# Patient Record
Sex: Female | Born: 1960
Health system: Southern US, Community
[De-identification: ages and names within clinical notes are randomized; demographics above are authoritative.]

## PROBLEM LIST (undated history)

## (undated) DIAGNOSIS — I499 Cardiac arrhythmia, unspecified: Secondary | ICD-10-CM

## (undated) DIAGNOSIS — R011 Cardiac murmur, unspecified: Secondary | ICD-10-CM

## (undated) DIAGNOSIS — I493 Ventricular premature depolarization: Secondary | ICD-10-CM

## (undated) DIAGNOSIS — M1990 Unspecified osteoarthritis, unspecified site: Secondary | ICD-10-CM

## (undated) DIAGNOSIS — J45909 Unspecified asthma, uncomplicated: Secondary | ICD-10-CM

## (undated) HISTORY — DX: Unspecified osteoarthritis, unspecified site: M19.90

## (undated) HISTORY — DX: Unspecified asthma, uncomplicated: J45.909

## (undated) HISTORY — DX: Cardiac murmur, unspecified: R01.1

## (undated) HISTORY — DX: Ventricular premature depolarization: I49.3

## (undated) HISTORY — DX: Cardiac arrhythmia, unspecified: I49.9

---

## 2015-04-10 HISTORY — PX: NOSE SURGERY: SHX723

## 2016-02-29 LAB — HM DEXA SCAN

## 2016-03-20 ENCOUNTER — Encounter: Payer: Self-pay | Admitting: Family Medicine

## 2019-07-21 ENCOUNTER — Telehealth: Payer: Self-pay | Admitting: Family Medicine

## 2019-07-21 NOTE — Telephone Encounter (Signed)
Medical records received for new pt. First appt is 08/20/2019. Records from [facility]

## 2019-08-20 ENCOUNTER — Ambulatory Visit (INDEPENDENT_AMBULATORY_CARE_PROVIDER_SITE_OTHER): Payer: 59 | Admitting: Family Medicine

## 2019-08-20 ENCOUNTER — Telehealth: Payer: Self-pay | Admitting: Internal Medicine

## 2019-08-20 ENCOUNTER — Other Ambulatory Visit: Payer: Self-pay

## 2019-08-20 ENCOUNTER — Encounter: Payer: Self-pay | Admitting: Family Medicine

## 2019-08-20 VITALS — BP 120/78 | HR 63 | Temp 97.8°F | Ht 63.5 in | Wt 129.6 lb

## 2019-08-20 DIAGNOSIS — J4520 Mild intermittent asthma, uncomplicated: Secondary | ICD-10-CM

## 2019-08-20 DIAGNOSIS — I493 Ventricular premature depolarization: Secondary | ICD-10-CM | POA: Insufficient documentation

## 2019-08-20 DIAGNOSIS — R002 Palpitations: Secondary | ICD-10-CM

## 2019-08-20 DIAGNOSIS — R1010 Upper abdominal pain, unspecified: Secondary | ICD-10-CM | POA: Diagnosis not present

## 2019-08-20 MED ORDER — ALBUTEROL SULFATE HFA 108 (90 BASE) MCG/ACT IN AERS
2.0000 | INHALATION_SPRAY | Freq: Four times a day (QID) | RESPIRATORY_TRACT | 1 refills | Status: DC | PRN
Start: 2019-08-20 — End: 2020-08-09

## 2019-08-20 MED ORDER — ESTRADIOL 0.0375 MG/24HR TD PTWK: 0.0375 mg | MEDICATED_PATCH | TRANSDERMAL | 5 refills | Status: DC

## 2019-08-20 MED ORDER — PROGESTERONE MICRONIZED 100 MG PO CAPS: 100.0000 mg | ORAL_CAPSULE | Freq: Every day | ORAL | 5 refills | Status: DC

## 2019-08-20 NOTE — Telephone Encounter (Signed)
No prior auth for Echo is needed. I have called Misty Stanley at [facility] who handles Echo and left a message to call back or all pt to schedule.  Reference number [id number] [email or web address]

## 2019-08-20 NOTE — Progress Notes (Signed)
Subjective:    Patient ID: Jill Barry, female    DOB: [birth year], 59 y.o.   MRN: [id number]  HPI Chief Complaint  Patient presents with  . new pt    new pt get established. seen cardiology for PVC and had echo, and plan was to have enough Echo- possibly cardiology referral,    She is new to the practice and here to establish care.  Her husband is with her today.  He is a hospitalist with [employer] [facility] [employer].  They recently moved here from [place]. Previous medical care: Dr. Alma Friendly in [place]. OB/GYN in [place]  Cardiologist in [place].   Her main concern today is being able to follow-up on palpitations, frequent PVCs and reportedly is due for a follow-up echocardiogram this year.  I have not yet received records from her cardiologist. they would like for me to order the echo and then they will establish with cardiology as well.  Her husband states he will call and schedule with EP physician. States palpitations are not as bad as they have been in the past but she is symptomatic. Reports she was on a beta-blocker for a few months but stopped it approximately 1 year ago.  States she did not like how she felt on the beta-blocker.  Asthma -states she was diagnosed several years ago.  Reports only needing albuterol 1 x per week on average. Used Advair last year but is no longer needing it.  She has been on hormone replacement therapy for the past 8 years.  This was prescribed by her OB/GYN in [place].  She is almost out of her refills.  States she was severely symptomatic before starting on hormone replacement and does not want to come off of this medication yet.  She is requesting a 1 year refill. Denies ever having any adverse effects from hormone therapy including blood clots.  Stopped periods at age 72.  History of normal DEXA. States she is taking calcium and vitamin D   She complains of intermittent upper abdominal pain.  Pain is consistently after eating. Her husband states he  questions if she could have H. pylori.  States he has been treated for H. pylori in the past.  Denies fever, chills, night sweats, fatigue, unexplained weight loss, headaches, dizziness, chest pain, shortness of breath, vomiting or diarrhea.  States her bowel movements are normal.   Health maintenance:  Mammogram: 2019 per patient  Colonoscopy: UTD per patient    Reviewed allergies, medications, past medical, surgical, family, and social history.    Review of Systems Pertinent positives and negatives in the history of present illness.     Objective:   Physical Exam Constitutional:      Appearance: Normal appearance.  Eyes:     Conjunctiva/sclera: Conjunctivae normal.     Pupils: Pupils are equal, round, and reactive to light.  Cardiovascular:     Rate and Rhythm: Normal rate and regular rhythm.     Pulses: Normal pulses.     Heart sounds: Normal heart sounds.  Pulmonary:     Effort: Pulmonary effort is normal.     Breath sounds: Normal breath sounds.  Abdominal:     General: Abdomen is flat. Bowel sounds are normal. There is no distension.     Palpations: Abdomen is soft. There is no mass.     Tenderness: There is no abdominal tenderness. There is no guarding or rebound.  Musculoskeletal:     Cervical back: Neck supple.  Right lower leg: No edema.     Left lower leg: No edema.  Skin:    General: Skin is warm and dry.     Capillary Refill: Capillary refill takes less than 2 seconds.  Neurological:     General: No focal deficit present.     Mental Status: She is alert and oriented to person, place, and time.     Cranial Nerves: No cranial nerve deficit.     Sensory: No sensory deficit.     Motor: No weakness.     Coordination: Coordination normal.  Psychiatric:        Mood and Affect: Mood normal.        Thought Content: Thought content normal.    BP 120/78   Pulse 63   Temp 97.8 F (36.6 C)   Ht 5' 3.5" (1.613 m)   Wt 129 lb 9.6 oz (58.8 kg)   SpO2 97%    BMI 22.60 kg/m       Assessment & Plan:  Palpitations - Plan: ECHOCARDIOGRAM COMPLETE, EKG 12-Lead -She is a pleasant 59 year old female who is new to the practice.  I do not have medical records from her cardiologist in [place].  She and her husband are quite knowledgeable about her health history.  Her husband is a hospitalist.  She is reportedly due for a follow-up echocardiogram which I will order today.  They will let me know if they need a referral in the system for cardiology.  Her husband plans to schedule this appointment.  Frequent PVCs - Plan: EKG 12-Lead -EKG shows NSR, rate 64 and unremarkable.  No PVCs observed  Upper abdominal pain -Discussed possible etiologies including GERD, stomach ulcer.  I will refer her to GI for further evaluation. Husband with hx of H. Pylori  Mild intermittent asthma without complication -well controlled. She will let me know if she is needing her rescue inhaler more than 2x per week.   Declines labs. States recent thyroid and electrolyte panel normal.  Follow up for fasting CPE.

## 2019-08-21 ENCOUNTER — Encounter: Payer: Self-pay | Admitting: Family Medicine

## 2019-08-21 ENCOUNTER — Encounter: Payer: Self-pay | Admitting: Nurse Practitioner

## 2019-08-25 ENCOUNTER — Encounter: Payer: Self-pay | Admitting: Family Medicine

## 2019-09-14 ENCOUNTER — Other Ambulatory Visit (INDEPENDENT_AMBULATORY_CARE_PROVIDER_SITE_OTHER): Payer: 59

## 2019-09-14 ENCOUNTER — Encounter: Payer: Self-pay | Admitting: Nurse Practitioner

## 2019-09-14 ENCOUNTER — Ambulatory Visit (INDEPENDENT_AMBULATORY_CARE_PROVIDER_SITE_OTHER): Payer: 59 | Admitting: Nurse Practitioner

## 2019-09-14 VITALS — BP 118/76 | HR 83 | Ht 63.0 in | Wt 130.2 lb

## 2019-09-14 DIAGNOSIS — R1013 Epigastric pain: Secondary | ICD-10-CM

## 2019-09-14 LAB — COMPREHENSIVE METABOLIC PANEL
ALT: 17 U/L (ref 0–35)
AST: 18 U/L (ref 0–37)
Albumin: 4.7 g/dL (ref 3.5–5.2)
Alkaline Phosphatase: 79 U/L (ref 39–117)
BUN: 10 mg/dL (ref 6–23)
CO2: 28 mEq/L (ref 19–32)
Calcium: 9.6 mg/dL (ref 8.4–10.5)
Chloride: 102 mEq/L (ref 96–112)
Creatinine, Ser: 0.69 mg/dL (ref 0.40–1.20)
GFR: 87.12 mL/min (ref >60.00)
Glucose, Bld: 104 mg/dL — ABNORMAL HIGH (ref 70–99)
Potassium: 3.9 mEq/L (ref 3.5–5.1)
Sodium: 137 mEq/L (ref 135–145)
Total Bilirubin: 0.5 mg/dL (ref 0.2–1.2)
Total Protein: 7.4 g/dL (ref 6.0–8.3)

## 2019-09-14 LAB — CBC
HCT: 42.7 % (ref 36.0–46.0)
Hemoglobin: 14.8 g/dL (ref 12.0–15.0)
MCHC: 34.8 g/dL (ref 30.0–36.0)
MCV: 96 fl (ref 78.0–100.0)
Platelets: 225 10*3/uL (ref 150.0–400.0)
RBC: 4.44 Mil/uL (ref 3.87–5.11)
RDW: 13.1 % (ref 11.5–15.5)
WBC: 6.7 10*3/uL (ref 4.0–10.5)

## 2019-09-14 LAB — LIPASE: Lipase: 20 U/L (ref 11.0–59.0)

## 2019-09-14 NOTE — Progress Notes (Signed)
ASSESSMENT / PLAN:   59 yo female with PMH significant for PVCs.   --Obtain basic labs, she hasn't had any in a year.  --No benefit with three weeks of Omeprazole. Stop Omeprazole since it hasn't been of benefit. Also gastric biopsies ( if done) may be more sensitive to H.pylori off PPI.  --She is very worried about a gastric tumor ---For further evaluation patient will be scheduled for EGD. The risks and benefits of EGD were discussed and the patient agrees to proceed.   # Colon cancer screening --Normal colonoscopy in [place] in 2014 ( she brought colonoscopy discharge instructions) --Will request full report  HPI:     Chief Complaint:  Upper abdominal pain.   Patient is a 59 yo female referred by PCP for evaluation of upper abdominal pain .  Patient is married to a Oncologist .   Over the last year patient has been having nonradiating, postprandial epigastric/LUQ pain.  Pain occurs after every meal regardless of what she eats but worse after larger meals. Symptoms only present after eating. Initially pain was more of a twisting discomfort but recently evolved into stabbing pain.  Drinking small amount of warm water and laying down helps.   No nausea nor vomiting. No weight loss, she gained ~ 15 pounds over the last year during the pandemic.  No hx of PUD.  No NSAID use.  She took Omeprazole 20 mg for 20 days without any benefit. She is very concerned about possibility of stomach cancer. Husband had H.pylori.   Hx of PVCs, none on EKG 08/20/19 at PCP's office. Scheduled for echo on 09/16/19.   Past Medical History:  Diagnosis Date  . Arthritis   . Asthma   . Heart murmur   . Irregular heart beat     Past Surgical History:  Procedure Laterality Date  . NOSE SURGERY  2017   Family History  Problem Relation Age of Onset  . Hypertension Father   . Diabetes Father   . Hyperlipidemia Father   . Cervical cancer Sister    Social History   Tobacco Use  .  Smoking status: Never Smoker  . Smokeless tobacco: Never Used  Substance Use Topics  . Alcohol use: Never  . Drug use: Never   Current Outpatient Medications  Medication Sig Dispense Refill  . albuterol (VENTOLIN HFA) 108 (90 Base) MCG/ACT inhaler Inhale 2 puffs into the lungs every 6 (six) hours as needed for wheezing or shortness of breath. 18 g 1  . esomeprazole (NEXIUM) 20 MG capsule Take 20 mg by mouth daily at 12 noon.    Marland Kitchen estradiol (CLIMARA - DOSED IN MG/24 HR) 0.0375 mg/24hr patch Place 1 patch (0.0375 mg total) onto the skin once a week. 4 patch 5  . progesterone (PROMETRIUM) 100 MG capsule Take 1 capsule (100 mg total) by mouth daily. 30 capsule 5   No current facility-administered medications for this visit.   No Known Allergies   Review of Systems: Positive for back pain, anxiety, arthritis, allergies.   All other systems reviewed and negative except where noted in HPI.   Creatinine clearance cannot be calculated (No successful lab value found.)   Physical Exam:    Wt Readings from Last 3 Encounters:  09/14/19 130 lb 3.2 oz (59.1 kg)  08/20/19 129 lb 9.6 oz (58.8 kg)    BP 118/76   Pulse 83   Ht 5\' 3"  (1.6 m)  Wt 130 lb 3.2 oz (59.1 kg)   SpO2 99%   BMI 23.06 kg/m  Constitutional:  Pleasant female in no acute distress. Psychiatric: Normal mood and affect. Behavior is normal. EENT: Pupils normal.  Conjunctivae are normal. No scleral icterus. Neck supple.  Cardiovascular: Normal rate, occas irregular beat. No edema Pulmonary/chest: Effort normal and breath sounds normal. No wheezing, rales or rhonchi. Abdominal: Soft, nondistended, nontender. Bowel sounds active throughout. There are no masses palpable. No hepatomegaly. Neurological: Alert and oriented to person place and time. Skin: Skin is warm and dry. No rashes noted.  Tye Savoy, NP  09/14/2019, 10:58 AM  Cc:  Referring Provider Girtha Rm, NP-C

## 2019-09-14 NOTE — Patient Instructions (Signed)
If you are age 59 or older, your body mass index should be between 23-30. Your Body mass index is 23.06 kg/m. If this is out of the aforementioned range listed, please consider follow up with your Primary Care Provider.  If you are age 11 or younger, your body mass index should be between 19-25. Your Body mass index is 23.06 kg/m. If this is out of the aformentioned range listed, please consider follow up with your Primary Care Provider.    Stop your omeprazole.  Your provider has requested that you go to the basement level for lab work before leaving today. Press "B" on the elevator. The lab is located at the first door on the left as you exit the elevator.  You have been scheduled for an endoscopy. Please follow written instructions given to you at your visit today. If you use inhalers (even only as needed), please bring them with you on the day of your procedure.  Due to recent changes in healthcare laws, you may see the results of your imaging and laboratory studies on MyChart before your provider has had a chance to review them.  We understand that in some cases there may be results that are confusing or concerning to you. Not all laboratory results come back in the same time frame and the provider may be waiting for multiple results in order to interpret others.  Please give [place] 48 hours in order for your provider to thoroughly review all the results before contacting the office for clarification of your results.

## 2019-09-14 NOTE — Progress Notes (Signed)
Reviewed and agree with documentation and assessment and plan. K. Veena Ayshia Gramlich , MD   

## 2019-09-16 ENCOUNTER — Ambulatory Visit (HOSPITAL_COMMUNITY): Payer: 59 | Attending: Cardiology

## 2019-09-16 ENCOUNTER — Other Ambulatory Visit: Payer: Self-pay

## 2019-09-16 DIAGNOSIS — R002 Palpitations: Secondary | ICD-10-CM | POA: Diagnosis not present

## 2019-09-17 NOTE — Progress Notes (Signed)
Subjective:    Patient ID: Jill Barry, female    DOB: November 21, 1960, 60 y.o.   MRN: [id number]  HPI Chief Complaint  Patient presents with   cpe    non fasting cpe, sees obgyn. stomach issues   She is fairly new to the practice and here for a complete physical exam. Previous medical care: moved here recently from [place].   Other providers: GI- Dr. Silverio Decamp   Recently saw GI for upper abdominal discomfort and is scheduled to have an EGD soon.  She stopped PPIs for the study.  H she is currently on hormone therapy and has been since 2013. She does not want to stop treatment.  Plans to schedule with an OB/GYN soon.  Would like a referral.  States she has trigger finger on her right hand which seems to be getting worse and is interfering with her daily life.  Would like to see a hand specialist.  She is tearful talking about her sister who was recently diagnosed with stage IV cervical cancer.  Her sister lives in [place].  She denies depression or anxiety.  I have ordered an echocardiogram due to history of palpitations.  She and her husband were going to call and schedule with a cardiologist but they have not done so.  States she wants to get her EGD and GI work-up completed before pursuing a cardiology work-up.  She denies worsening palpitations or chest pain, DOE or LE edema.  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  Denies smoking, drinking alcohol, drug use  Diet: fairly healthy but high in carbs. Excerise: Zumba   Immunizations: Pfizer Covid vaccines received with last one 3 weeks ago   Health maintenance:  Mammogram: last one in [place] 2019  Colonoscopy: 2014 in [place] and normal per records.  Last Gynecological Exam: In the past 2 years DEXA: 2019 - normal  Last Dental Exam: In the past 2 months Last Eye Exam: Has been years  Wears seatbelt always, uses sunscreen, smoke detectors in home and functioning, does not text while  driving and feels safe in home environment.   Reviewed allergies, medications, past medical, surgical, family, and social history.   Review of Systems Review of Systems Constitutional: -fever, -chills, -sweats, -unexpected weight change,-fatigue ENT: -runny nose, -ear pain, -sore throat Cardiology:  -chest pain, -palpitations, -edema Respiratory: -cough, -shortness of breath, -wheezing Gastroenterology: +abdominal pain, -nausea, -vomiting, -diarrhea, -constipation  Hematology: -bleeding or bruising problems Musculoskeletal: -arthralgias, -myalgias, -joint swelling, -back pain Ophthalmology: -vision changes Urology: -dysuria, -difficulty urinating, -hematuria, -urinary frequency, -urgency Neurology: -headache, -weakness, -tingling, -numbness       Objective:   Physical Exam BP 140/70    Pulse 74    Ht 5' 3.25" (1.607 m)    Wt 128 lb 12.8 oz (58.4 kg)    BMI 22.64 kg/m   General Appearance:    Alert, cooperative, no distress, appears stated age  Head:    Normocephalic, without obvious abnormality, atraumatic  Eyes:    PERRL, conjunctiva/corneas clear, EOM's intact  Ears:    Normal TM's and external ear canals  Nose:  Mask in place  Throat:  Mask in place  Neck:   Supple, no lymphadenopathy;  thyroid:  no   enlargement/tenderness/nodules; no JVD  Back:    Spine nontender, no curvature, ROM normal, no CVA     tenderness  Lungs:     Clear to auscultation bilaterally without wheezes, rales or  ronchi; respirations unlabored  Chest Wall:    No tenderness or deformity   Heart:    Regular rate and rhythm, no murmur, rub   or gallop  Breast Exam:   Referred to OB/GYN  Abdomen:     Soft, non-tender, nondistended, normoactive bowel sounds,    no masses, no hepatosplenomegaly  Genitalia:   Referred to OB/GYN per her request     Extremities:   No clubbing, cyanosis or edema.  Trigger finger of her third digit on right hand.  Pulses:   2+ and symmetric all extremities  Skin:   Skin  color, texture, turgor normal, no rashes or lesions  Lymph nodes:   Cervical, supraclavicular, and axillary nodes normal  Neurologic:   CNII-XII intact, normal strength, sensation and gait; reflexes 2+ and symmetric throughout          Psych:   Normal mood, affect, hygiene and grooming.        Assessment & Plan:  Routine general medical examination at a health care facility -She is here today for a CPE.  This is her second visit with me.  Preventive health care reviewed.  I will refer her to OB/GYN for further examination and to update Pap smear.  Mammogram ordered and she will call to schedule this.  Counseling on healthy lifestyle including diet and exercise.  Immunizations reviewed.  She may return at her convenience for Shingrix vaccine.  Recommend that she wait at least 2 more weeks since she reports having had her second Covid vaccine 3 weeks ago.  Discussed safety and health promotion.  Encounter for screening for malignant neoplasm of breast, unspecified screening modality -She will call and schedule her mammogram at the breast center as discussed.  On hormone replacement therapy - Plan: Ambulatory referral to Gynecology -Discussed potential side effects.  She will follow-up with her OB/GYN regarding coming off of hormones.  Trigger middle finger of right hand - Plan: Ambulatory referral to Hand Surgery -This has become quite bothersome for her and she would like to see a specialist.  Referral made.  Screening for lipid disorders - Plan: Lipid panel -Follow-up pending results  Screening for thyroid disorder - Plan: TSH, T4, free, T3 -Follow-up pending results  Need for hepatitis C screening test - Plan: Hepatitis C antibody -Done per guidelines  Reviewed normal echocardiogram results with patient and her husband.  They have not yet called to schedule with a cardiologist as we discussed at her previous visit.  I recommend they do this.  They will let me know if they need a  referral.

## 2019-09-17 NOTE — Patient Instructions (Addendum)
You will receive a call from [facility] gynecology to schedule a visit  You will also receive a call from the hand center to schedule about the trigger finger  Call and schedule your mammogram at [facility].   You can call here and return for the shingles vaccine but give it a few more weeks since you just received the Covid vaccine recently.      Preventive Care 10-59 Years Old, Female Preventive care refers to visits with your health care provider and lifestyle choices that can promote health and wellness. This includes:  A yearly physical exam. This may also be called an annual well check.  Regular dental visits and eye exams.  Immunizations.  Screening for certain conditions.  Healthy lifestyle choices, such as eating a healthy diet, getting regular exercise, not using drugs or products that contain nicotine and tobacco, and limiting alcohol use. What can I expect for my preventive care visit? Physical exam Your health care provider will check your:  Height and weight. This may be used to calculate body mass index (BMI), which tells if you are at a healthy weight.  Heart rate and blood pressure.  Skin for abnormal spots. Counseling Your health care provider may ask you questions about your:  Alcohol, tobacco, and drug use.  Emotional well-being.  Home and relationship well-being.  Sexual activity.  Eating habits.  Work and work Statistician.  Method of birth control.  Menstrual cycle.  Pregnancy history. What immunizations do I need?  Influenza (flu) vaccine  This is recommended every year. Tetanus, diphtheria, and pertussis (Tdap) vaccine  You may need a Td booster every 10 years. Varicella (chickenpox) vaccine  You may need this if you have not been vaccinated. Zoster (shingles) vaccine  You may need this after age 9. Measles, mumps, and rubella (MMR) vaccine  You may need at least one dose of MMR if you were born in 1957 or later. You  may also need a second dose. Pneumococcal conjugate (PCV13) vaccine  You may need this if you have certain conditions and were not previously vaccinated. Pneumococcal polysaccharide (PPSV23) vaccine  You may need one or two doses if you smoke cigarettes or if you have certain conditions. Meningococcal conjugate (MenACWY) vaccine  You may need this if you have certain conditions. Hepatitis A vaccine  You may need this if you have certain conditions or if you travel or work in places where you may be exposed to hepatitis A. Hepatitis B vaccine  You may need this if you have certain conditions or if you travel or work in places where you may be exposed to hepatitis B. Haemophilus influenzae type b (Hib) vaccine  You may need this if you have certain conditions. Human papillomavirus (HPV) vaccine  If recommended by your health care provider, you may need three doses over 6 months. You may receive vaccines as individual doses or as more than one vaccine together in one shot (combination vaccines). Talk with your health care provider about the risks and benefits of combination vaccines. What tests do I need? Blood tests  Lipid and cholesterol levels. These may be checked every 5 years, or more frequently if you are over 3 years old.  Hepatitis C test.  Hepatitis B test. Screening  Lung cancer screening. You may have this screening every year starting at age 45 if you have a 30-pack-year history of smoking and currently smoke or have quit within the past 15 years.  Colorectal cancer screening. All adults  should have this screening starting at age 42 and continuing until age 34. Your health care provider may recommend screening at age 44 if you are at increased risk. You will have tests every 1-10 years, depending on your results and the type of screening test.  Diabetes screening. This is done by checking your blood sugar (glucose) after you have not eaten for a while (fasting). You  may have this done every 1-3 years.  Mammogram. This may be done every 1-2 years. Talk with your health care provider about when you should start having regular mammograms. This may depend on whether you have a family history of breast cancer.  BRCA-related cancer screening. This may be done if you have a family history of breast, ovarian, tubal, or peritoneal cancers.  Pelvic exam and Pap test. This may be done every 3 years starting at age 36. Starting at age 55, this may be done every 5 years if you have a Pap test in combination with an HPV test. Other tests  Sexually transmitted disease (STD) testing.  Bone density scan. This is done to screen for osteoporosis. You may have this scan if you are at high risk for osteoporosis. Follow these instructions at home: Eating and drinking  Eat a diet that includes fresh fruits and vegetables, whole grains, lean protein, and low-fat dairy.  Take vitamin and mineral supplements as recommended by your health care provider.  Do not drink alcohol if: ? Your health care provider tells you not to drink. ? You are pregnant, may be pregnant, or are planning to become pregnant.  If you drink alcohol: ? Limit how much you have to 0-1 drink a day. ? Be aware of how much alcohol is in your drink. In the [place] one drink equals one 12 oz bottle of beer (355 mL), one 5 oz glass of wine (148 mL), or one 1 oz glass of hard liquor (44 mL). Lifestyle  Take daily care of your teeth and gums.  Stay active. Exercise for at least 30 minutes on 5 or more days each week.  Do not use any products that contain nicotine or tobacco, such as cigarettes, e-cigarettes, and chewing tobacco. If you need help quitting, ask your health care provider.  If you are sexually active, practice safe sex. Use a condom or other form of birth control (contraception) in order to prevent pregnancy and STIs (sexually transmitted infections).  If told by your health care provider,  take low-dose aspirin daily starting at age 21. What's next?  Visit your health care provider once a year for a well check visit.  Ask your health care provider how often you should have your eyes and teeth checked.  Stay up to date on all vaccines. This information is not intended to replace advice given to you by your health care provider. Make sure you discuss any questions you have with your health care provider. Document Revised: 12/05/2017 Document Reviewed: 12/05/2017 Elsevier Patient Education  2020 [employer].

## 2019-09-18 ENCOUNTER — Encounter: Payer: Self-pay | Admitting: Family Medicine

## 2019-09-18 ENCOUNTER — Ambulatory Visit (INDEPENDENT_AMBULATORY_CARE_PROVIDER_SITE_OTHER): Payer: 59 | Admitting: Family Medicine

## 2019-09-18 ENCOUNTER — Other Ambulatory Visit: Payer: Self-pay

## 2019-09-18 VITALS — BP 140/70 | HR 74 | Ht 63.25 in | Wt 128.8 lb

## 2019-09-18 DIAGNOSIS — Z1159 Encounter for screening for other viral diseases: Secondary | ICD-10-CM

## 2019-09-18 DIAGNOSIS — Z79890 Hormone replacement therapy (postmenopausal): Secondary | ICD-10-CM

## 2019-09-18 DIAGNOSIS — M65331 Trigger finger, right middle finger: Secondary | ICD-10-CM | POA: Diagnosis not present

## 2019-09-18 DIAGNOSIS — Z13220 Encounter for screening for lipoid disorders: Secondary | ICD-10-CM | POA: Diagnosis not present

## 2019-09-18 DIAGNOSIS — Z0000 Encounter for general adult medical examination without abnormal findings: Secondary | ICD-10-CM | POA: Diagnosis not present

## 2019-09-18 DIAGNOSIS — Z1329 Encounter for screening for other suspected endocrine disorder: Secondary | ICD-10-CM

## 2019-09-18 DIAGNOSIS — Z1239 Encounter for other screening for malignant neoplasm of breast: Secondary | ICD-10-CM | POA: Diagnosis not present

## 2019-09-19 LAB — TSH: TSH: 2.4 u[IU]/mL (ref 0.450–4.500)

## 2019-09-19 LAB — LIPID PANEL
Chol/HDL Ratio: 3.8 ratio (ref 0.0–4.4)
Cholesterol, Total: 242 mg/dL — ABNORMAL HIGH (ref 100–199)
HDL: 64 mg/dL (ref >39)
LDL Chol Calc (NIH): 146 mg/dL — ABNORMAL HIGH (ref 0–99)
Triglycerides: 179 mg/dL — ABNORMAL HIGH (ref 0–149)
VLDL Cholesterol Cal: 32 mg/dL (ref 5–40)

## 2019-09-19 LAB — T3: T3, Total: 85 ng/dL (ref 71–180)

## 2019-09-19 LAB — HEPATITIS C ANTIBODY: Hep C Virus Ab: <0.1 s/co ratio (ref 0.0–0.9)

## 2019-09-19 LAB — T4, FREE: Free T4: 1.32 ng/dL (ref 0.82–1.77)

## 2019-09-20 NOTE — Progress Notes (Signed)
Her labs are fine including her thyroid function. She is negative for hepatitis C. Her LDL or bad cholesterol is elevated but her overall risk for heart disease is not high enough to start on a cholesterol medication per guidelines.

## 2019-09-28 MED FILL — ESTRADIOL TDS 0.0375 MG/DAY: 0.0375 | 28 days supply | Qty: 4 | Fill #1

## 2019-09-28 MED FILL — PROGESTERONE 100 MG CAPSULE: 100 | 30 days supply | Qty: 30 | Fill #1

## 2019-09-30 ENCOUNTER — Telehealth: Payer: Self-pay | Admitting: Family Medicine

## 2019-09-30 NOTE — Telephone Encounter (Signed)
Husband Dr Mohammed Kindle called and states issue with mammogram since left upper breast pain.  Dr Mohammed Kindle off this week. Please call Dr. Mohammed Kindle [phone]   Breast center thinks should be diagnostic instead of screening.

## 2019-09-30 NOTE — Telephone Encounter (Signed)
Pt is scheduled for tomorrow 

## 2019-10-01 ENCOUNTER — Ambulatory Visit (AMBULATORY_SURGERY_CENTER): Payer: 59 | Admitting: Gastroenterology

## 2019-10-01 ENCOUNTER — Encounter: Payer: Self-pay | Admitting: Gastroenterology

## 2019-10-01 ENCOUNTER — Ambulatory Visit: Payer: 59 | Admitting: Family Medicine

## 2019-10-01 ENCOUNTER — Encounter: Payer: Self-pay | Admitting: Family Medicine

## 2019-10-01 ENCOUNTER — Other Ambulatory Visit: Payer: Self-pay

## 2019-10-01 VITALS — BP 118/80 | HR 79 | Temp 97.7°F | Wt 129.8 lb

## 2019-10-01 VITALS — BP 138/79 | HR 57 | Temp 96.6°F | Resp 18 | Ht 63.0 in | Wt 130.0 lb

## 2019-10-01 DIAGNOSIS — K219 Gastro-esophageal reflux disease without esophagitis: Secondary | ICD-10-CM

## 2019-10-01 DIAGNOSIS — R109 Unspecified abdominal pain: Secondary | ICD-10-CM | POA: Diagnosis not present

## 2019-10-01 DIAGNOSIS — R1013 Epigastric pain: Secondary | ICD-10-CM

## 2019-10-01 DIAGNOSIS — K3189 Other diseases of stomach and duodenum: Secondary | ICD-10-CM | POA: Diagnosis not present

## 2019-10-01 DIAGNOSIS — K209 Esophagitis, unspecified without bleeding: Secondary | ICD-10-CM

## 2019-10-01 DIAGNOSIS — N644 Mastodynia: Secondary | ICD-10-CM | POA: Diagnosis not present

## 2019-10-01 DIAGNOSIS — K2970 Gastritis, unspecified, without bleeding: Secondary | ICD-10-CM | POA: Diagnosis not present

## 2019-10-01 DIAGNOSIS — Z1239 Encounter for other screening for malignant neoplasm of breast: Secondary | ICD-10-CM

## 2019-10-01 DIAGNOSIS — K449 Diaphragmatic hernia without obstruction or gangrene: Secondary | ICD-10-CM | POA: Diagnosis not present

## 2019-10-01 HISTORY — PX: UPPER GASTROINTESTINAL ENDOSCOPY: SHX188

## 2019-10-01 MED ORDER — LANSOPRAZOLE 30 MG PO CPDR: 30.0000 mg | DELAYED_RELEASE_CAPSULE | Freq: Every day | ORAL | 0 refills | Status: DC

## 2019-10-01 MED ORDER — SODIUM CHLORIDE 0.9 % IV SOLN: 500.0000 mL | Freq: Once | INTRAVENOUS | Status: DC

## 2019-10-01 MED FILL — LANSOPRAZOLE DR 30 MG CAPSU: 30 | 90 days supply | Qty: 90 | Fill #0

## 2019-10-01 NOTE — Progress Notes (Signed)
A/ox3, pleased with MAC, report to RN 

## 2019-10-01 NOTE — Progress Notes (Signed)
Called to room to assist during endoscopic procedure.  Patient ID and intended procedure confirmed with present staff. Received instructions for my participation in the procedure from the performing physician.  

## 2019-10-01 NOTE — Progress Notes (Signed)
   Subjective:    Patient ID: Jill Barry, female    DOB: [birth year], 59 y.o.   MRN: [id number]  HPI Chief Complaint  Patient presents with  . breast pain    left breast pain x 1 month. on top of breast.    Here with complaints of left breast pain x 1 month. She went to have her screening mammogram and she told the staff that she had been having breast pain and she is here for an evaluation.  No fever, chills, nipple discharge, skin changes, N/V.    Review of Systems Pertinent positives and negatives in the history of present illness.     Objective:   Physical Exam Constitutional:      General: She is not in acute distress.    Appearance: Normal appearance. She is not ill-appearing.  Chest:     Breasts:        Right: Normal. No mass, nipple discharge, skin change or tenderness.        Left: Normal. No mass, nipple discharge, skin change or tenderness.       Comments: Husband present for exam.  Lymphadenopathy:     Upper Body:     Right upper body: No axillary or pectoral adenopathy.     Left upper body: No axillary or pectoral adenopathy.  Neurological:     Mental Status: She is alert.    BP 118/80   Pulse 79   Temp 97.7 F (36.5 C)   Wt 129 lb 12.8 oz (58.9 kg)   SpO2 97%   BMI 22.99 kg/m       Assessment & Plan:  Breast pain, left - Plan: MM DIAG BREAST TOMO BILATERAL  Encounter for screening for malignant neoplasm of breast, unspecified screening modality - Plan: MM DIAG BREAST TOMO BILATERAL  I will send her for a diagnostic mammogram to rule out underlying problem on left and she needs her screening mammogram as well.

## 2019-10-01 NOTE — Progress Notes (Signed)
Pt's states no medical or surgical changes since previsit or office visit.  Vitals VV 

## 2019-10-01 NOTE — Patient Instructions (Signed)
Read all of the handouts given to you by your recovery room nurse.  Thank-you for choosing us for your healthcare needs today.  YOU HAD AN ENDOSCOPIC PROCEDURE TODAY AT THE [facility]:   Refer to the procedure report that was given to you for any specific questions about what was found during the examination.  If the procedure report does not answer your questions, please call your gastroenterologist to clarify.  If you requested that your care partner not be given the details of your procedure findings, then the procedure report has been included in a sealed envelope for you to review at your convenience later.  YOU SHOULD EXPECT: Some feelings of bloating in the abdomen. Passage of more gas than usual.  Walking can help get rid of the air that was put into your GI tract during the procedure and reduce the bloating.   Please Note:  You might notice some irritation and congestion in your nose or some drainage.  This is from the oxygen used during your procedure.  There is no need for concern and it should clear up in a day or so.  SYMPTOMS TO REPORT IMMEDIATELY:    Following upper endoscopy (EGD)  Vomiting of blood or coffee ground material  New chest pain or pain under the shoulder blades  Painful or persistently difficult swallowing  New shortness of breath  Fever of 100F or higher  Black, tarry-looking stools  For urgent or emergent issues, a gastroenterologist can be reached at any hour by calling [phone]. Do not use MyChart messaging for urgent concerns.    DIET:  We do recommend a small meal at first, but then you may proceed to your regular diet.  Drink plenty of fluids but you should avoid alcoholic beverages for 24 hours.  ACTIVITY:  You should plan to take it easy for the rest of today and you should NOT DRIVE or use heavy machinery until tomorrow (because of the sedation medicines used during the test).    FOLLOW UP: Our staff will call the number listed  on your records 48-72 hours following your procedure to check on you and address any questions or concerns that you may have regarding the information given to you following your procedure. If we do not reach you, we will leave a message.  We will attempt to reach you two times.  During this call, we will ask if you have developed any symptoms of COVID 19. If you develop any symptoms (ie: fever, flu-like symptoms, shortness of breath, cough etc.) before then, please call [phone].  If you test positive for Covid 19 in the 2 weeks post procedure, please call and report this information to us.    If any biopsies were taken you will be contacted by phone or by letter within the next 1-3 weeks.  Please call us at [phone] if you have not heard about the biopsies in 3 weeks.    SIGNATURES/CONFIDENTIALITY: You and/or your care partner have signed paperwork which will be entered into your electronic medical record.  These signatures attest to the fact that that the information above on your After Visit Summary has been reviewed and is understood.  Full responsibility of the confidentiality of this discharge information lies with you and/or your care-partner. 

## 2019-10-01 NOTE — Op Note (Signed)
[facility] Patient Name: Jill Barry Procedure Date: 10/01/2019 10:18 AM MRN: [id number] Endoscopist: Napoleon Form , MD Age: 59 Referring MD:  Date of Birth: [birth year] Gender: Female Account #: [id number] Procedure:                Upper GI endoscopy Indications:              Epigastric abdominal pain, Esophageal reflux                            symptoms that persist despite appropriate therapy,                            Weight loss Medicines:                Monitored Anesthesia Care Procedure:                Pre-Anesthesia Assessment:                           - Prior to the procedure, a History and Physical                            was performed, and patient medications and                            allergies were reviewed. The patient's tolerance of                            previous anesthesia was also reviewed. The risks                            and benefits of the procedure and the sedation                            options and risks were discussed with the patient.                            All questions were answered, and informed consent                            was obtained. Prior Anticoagulants: The patient has                            taken no previous anticoagulant or antiplatelet                            agents. ASA Grade Assessment: II - A patient with                            mild systemic disease. After reviewing the risks                            and benefits, the patient was deemed in  satisfactory condition to undergo the procedure.                           After obtaining informed consent, the endoscope was                            passed under direct vision. Throughout the                            procedure, the patient's blood pressure, pulse, and                            oxygen saturations were monitored continuously. The                            Endoscope was introduced through the mouth,  and                            advanced to the second part of duodenum. The upper                            GI endoscopy was accomplished without difficulty.                            The patient tolerated the procedure well. Scope In: Scope Out: Findings:                 LA Grade B (one or more mucosal breaks greater than                            5 mm, not extending between the tops of two mucosal                            folds) esophagitis with no bleeding was found 34 to                            36 cm from the incisors.                           A small hiatal hernia was present.                           Patchy mild inflammation characterized by                            congestion (edema) and erythema was found in the                            entire examined stomach. Biopsies were taken with a                            cold forceps for Helicobacter pylori testing.  Patchy mildly erythematous mucosa was found in the                            duodenal bulb. Biopsies for histology were taken                            with a cold forceps for evaluation of celiac                            disease.                           The exam was otherwise without abnormality. Complications:            No immediate complications. Estimated Blood Loss:     Estimated blood loss was minimal. Impression:               - LA Grade B reflux esophagitis with no bleeding.                           - Small hiatal hernia.                           - Gastritis. Biopsied.                           - Erythematous duodenopathy. Biopsied.                           - The examination was otherwise normal. Recommendation:           - Patient has a contact number available for                            emergencies. The signs and symptoms of potential                            delayed complications were discussed with the                            patient. Return to normal  activities tomorrow.                            Written discharge instructions were provided to the                            patient.                           - Resume previous diet.                           - Continue present medications.                           - Use Prevacid (lansoprazole) 30 mg PO daily for 3  months.                           - Follow an antireflux regimen indefinitely.                           - Return to GI office at the next available                            appointment in 3-4 months. Napoleon Form, MD 10/01/2019 10:39:55 AM This report has been signed electronically.

## 2019-10-05 ENCOUNTER — Other Ambulatory Visit: Payer: Self-pay | Admitting: Family Medicine

## 2019-10-05 ENCOUNTER — Telehealth: Payer: Self-pay

## 2019-10-05 DIAGNOSIS — Z1239 Encounter for other screening for malignant neoplasm of breast: Secondary | ICD-10-CM

## 2019-10-05 DIAGNOSIS — N644 Mastodynia: Secondary | ICD-10-CM

## 2019-10-05 NOTE — Telephone Encounter (Signed)
Covid-19 screening questions   Do you now or have you had a fever in the last 14 days? No.  Do you have any respiratory symptoms of shortness of breath or cough now or in the last 14 days? No.  Do you have any family members or close contacts with diagnosed or suspected Covid-19 in the past 14 days? No.  Have you been tested for Covid-19 and found to be positive? No.       Follow up Call-  Call back number 10/01/2019  Post procedure Call Back phone  # [phone]  Permission to leave phone message Yes     Patient questions:  Do you have a fever, pain , or abdominal swelling? No. Pain Score  0 *  Have you tolerated food without any problems? Yes.    Have you been able to return to your normal activities? Yes.    Do you have any questions about your discharge instructions: Diet   No. Medications  No. Follow up visit  No.  Do you have questions or concerns about your Care? No.  Actions: * If pain score is 4 or above: No action needed, pain <4.

## 2019-10-08 ENCOUNTER — Encounter: Payer: Self-pay | Admitting: Gastroenterology

## 2019-10-23 MED FILL — ESTRADIOL TDS 0.0375 MG/DAY: 0.0375 | 28 days supply | Qty: 4 | Fill #2

## 2019-10-23 MED FILL — PROGESTERONE 100 MG CAPSULE: 100 | 30 days supply | Qty: 30 | Fill #2

## 2019-11-26 MED FILL — PROGESTERONE 100 MG CAPSULE: 100 | 30 days supply | Qty: 30 | Fill #3

## 2019-11-26 MED FILL — ESTRADIOL TDS 0.0375 MG/DAY: 0.0375 | 28 days supply | Qty: 4 | Fill #3

## 2019-11-30 ENCOUNTER — Other Ambulatory Visit: Payer: Self-pay | Admitting: Family Medicine

## 2019-11-30 DIAGNOSIS — N644 Mastodynia: Secondary | ICD-10-CM

## 2019-12-02 ENCOUNTER — Ambulatory Visit
Admission: RE | Admit: 2019-12-02 | Discharge: 2019-12-02 | Disposition: A | Discharge status: Home / Self Care | Payer: 59 | Source: Ambulatory Visit | Attending: Family Medicine | Admitting: Family Medicine

## 2019-12-02 ENCOUNTER — Ambulatory Visit: Payer: 59

## 2019-12-02 ENCOUNTER — Other Ambulatory Visit: Payer: Self-pay

## 2019-12-02 DIAGNOSIS — N644 Mastodynia: Secondary | ICD-10-CM

## 2019-12-02 IMAGING — MG DIGITAL DIAGNOSTIC BILAT W/ TOMO W/ CAD
8 series · 9 of 24 positions shown · non-contrast
Comparison: Previous exam(s).

CLINICAL DATA: 59-year-old female presenting pain in the superior
left breast for several months which has now resolved. No problems
today.

EXAM:
DIGITAL DIAGNOSTIC BILATERAL MAMMOGRAM WITH TOMO AND CAD

[R CC synth-2D]
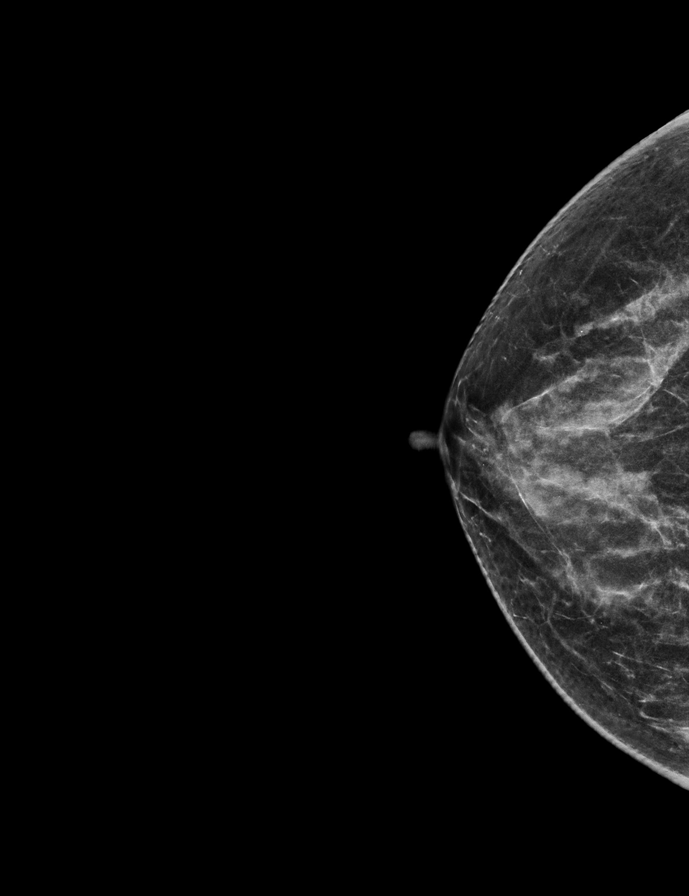

[L MLO synth-2D]
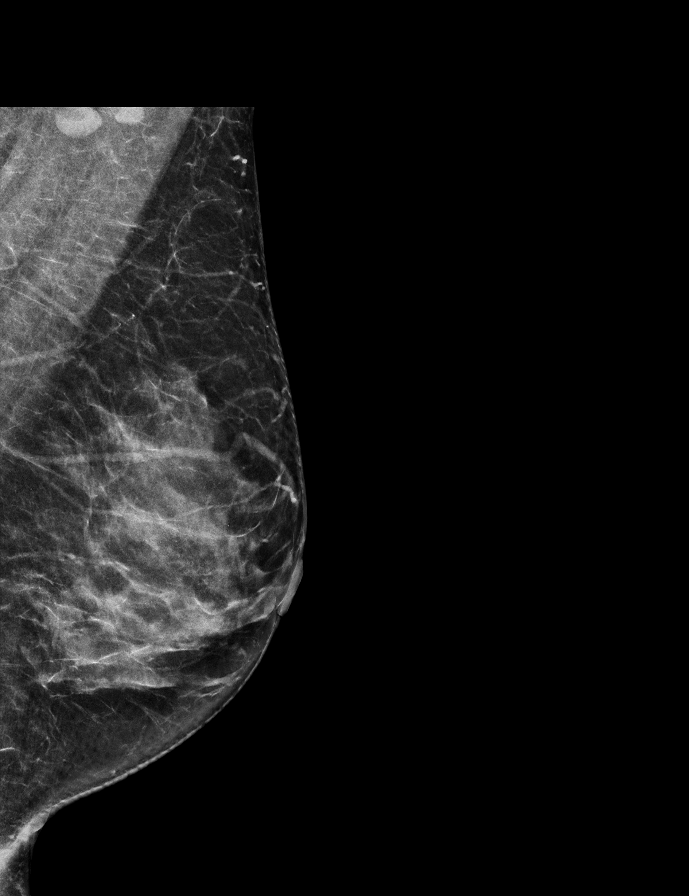

[R MLO synth-2D]
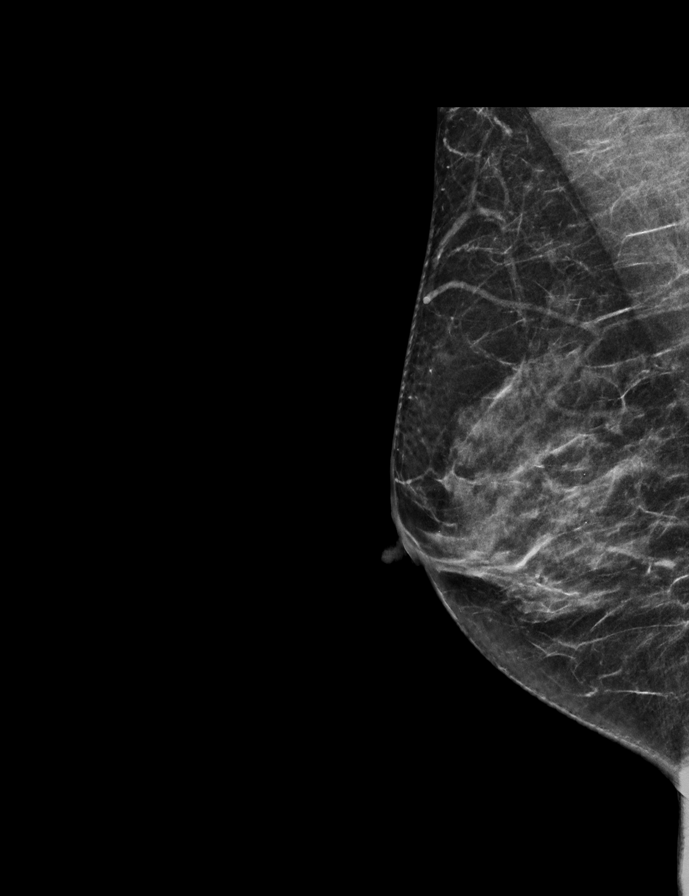

[L CC synth-2D]
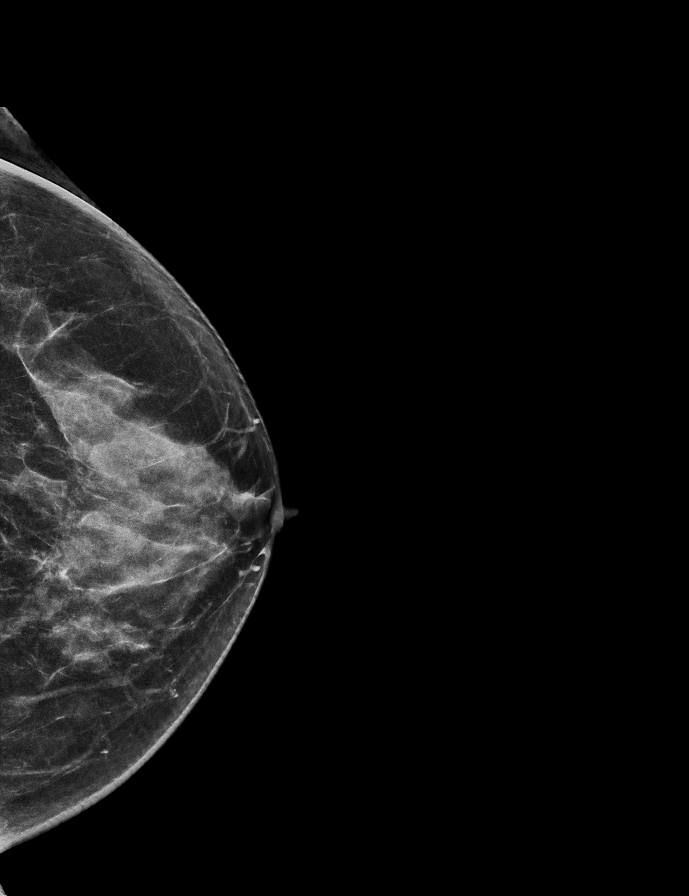

[L CC tomo · 2 of 59 frames shown]
[frame 20/59]
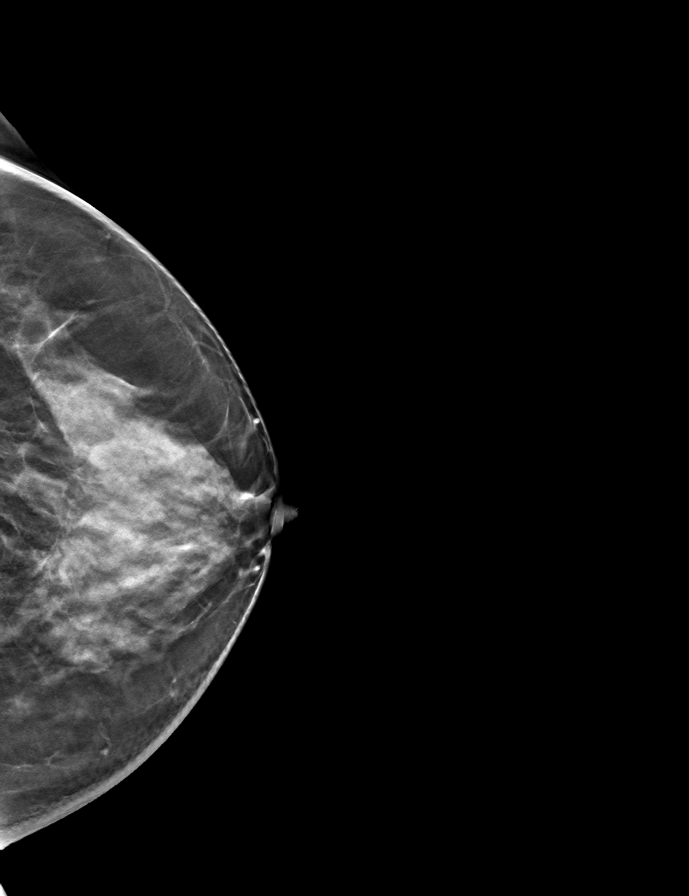
[frame 30/59]
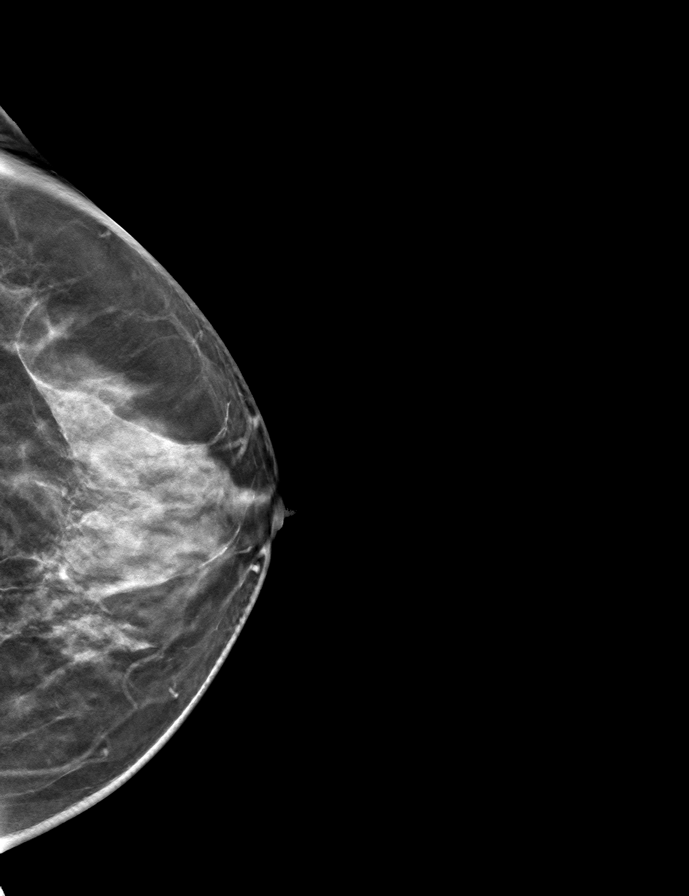

[L MLO tomo · tomo slice 30/59.0]
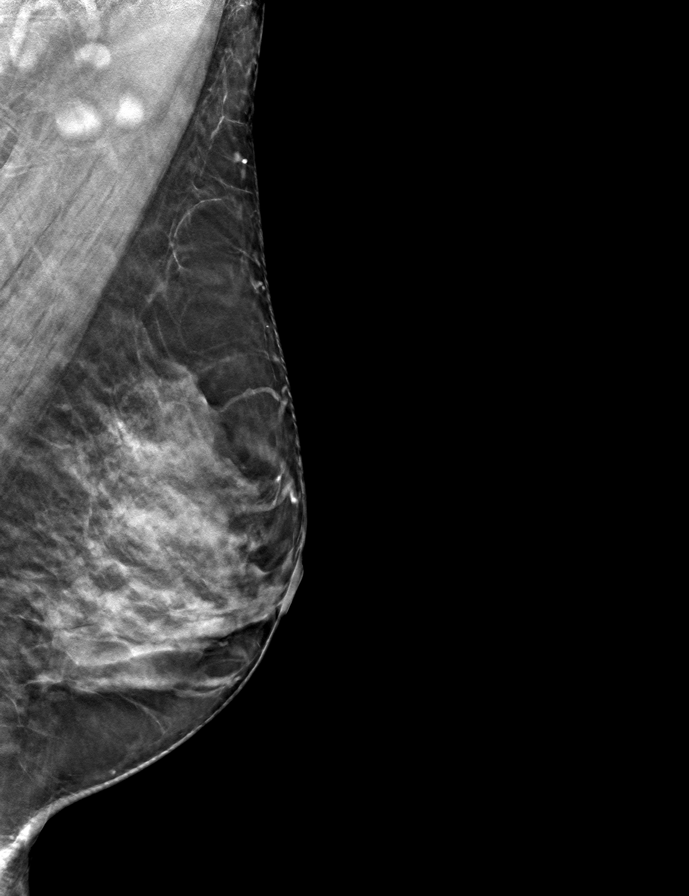

[R CC tomo · tomo slice 28/55.0]
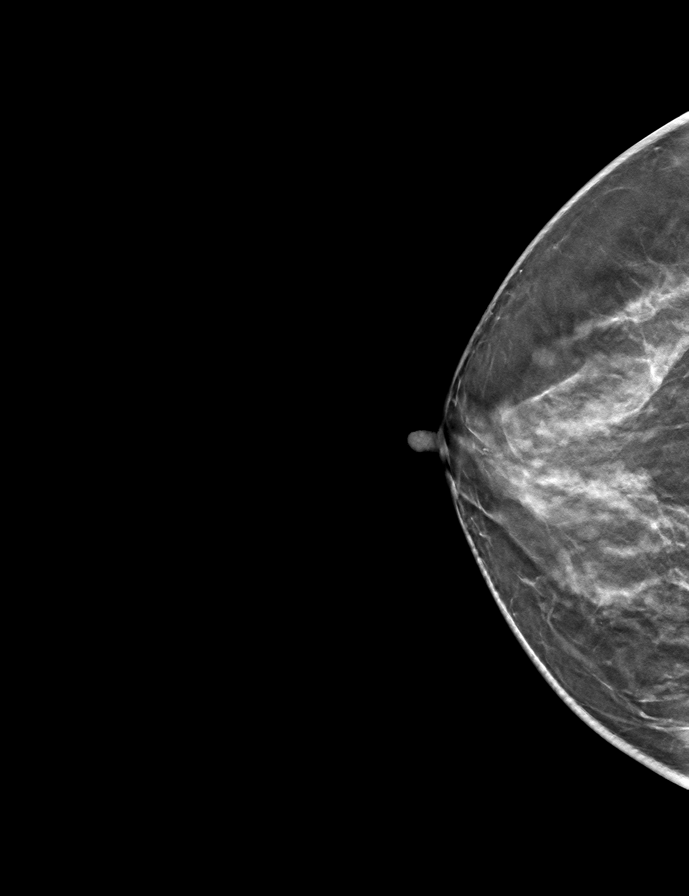

[R MLO tomo · tomo slice 27/52.0]
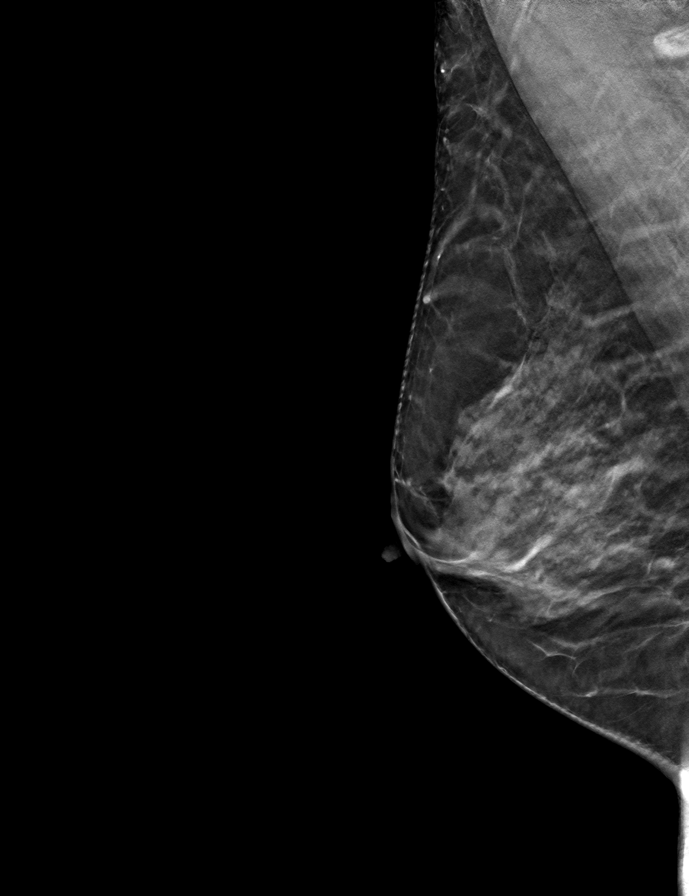

[9 of 24 positions shown; findings below may reference images not displayed]

ACR Breast Density Category c: The breast tissue is heterogeneously
dense, which may obscure small masses.
FINDINGS: Right breast: No suspicious mass, distortion, or microcalcifications
are identified to suggest presence of malignancy.

Left breast: No suspicious mass, distortion, or microcalcifications
are identified to suggest presence of malignancy.

Mammographic images were processed with CAD.
IMPRESSION: No mammographic evidence of malignancy in the bilateral breasts.

RECOMMENDATION:
Screening mammogram in one year.(Code:[D3])

I have discussed the findings and recommendations with the patient.
If applicable, a reminder letter will be sent to the patient
regarding the next appointment.

BI-RADS CATEGORY  1: Negative.

## 2019-12-09 ENCOUNTER — Encounter: Payer: Self-pay | Admitting: Nurse Practitioner

## 2019-12-09 ENCOUNTER — Other Ambulatory Visit: Payer: Self-pay | Admitting: Nurse Practitioner

## 2019-12-09 ENCOUNTER — Ambulatory Visit (INDEPENDENT_AMBULATORY_CARE_PROVIDER_SITE_OTHER): Payer: 59 | Admitting: Nurse Practitioner

## 2019-12-09 ENCOUNTER — Other Ambulatory Visit: Payer: Self-pay

## 2019-12-09 VITALS — BP 122/80 | Ht 62.0 in | Wt 129.0 lb

## 2019-12-09 DIAGNOSIS — Z124 Encounter for screening for malignant neoplasm of cervix: Secondary | ICD-10-CM | POA: Diagnosis not present

## 2019-12-09 DIAGNOSIS — Z01419 Encounter for gynecological examination (general) (routine) without abnormal findings: Secondary | ICD-10-CM

## 2019-12-09 DIAGNOSIS — Z79890 Hormone replacement therapy (postmenopausal): Secondary | ICD-10-CM

## 2019-12-09 MED ORDER — PROGESTERONE MICRONIZED 100 MG PO CAPS: 100.0000 mg | ORAL_CAPSULE | Freq: Every day | ORAL | 3 refills | Status: DC

## 2019-12-09 MED ORDER — ESTRADIOL 0.0375 MG/24HR TD PTWK: 0.0375 mg | MEDICATED_PATCH | TRANSDERMAL | 4 refills | Status: DC

## 2019-12-09 NOTE — Patient Instructions (Addendum)
Menopause and Hormone Replacement Therapy Menopause is a normal time of life when menstrual periods stop completely and the ovaries stop producing the female hormones estrogen and progesterone. This lack of hormones can affect your health and cause undesirable symptoms. Hormone replacement therapy (HRT) can relieve some of those symptoms. What is hormone replacement therapy? HRT is the use of artificial (synthetic) hormones to replace hormones that your body has stopped producing because you have reached menopause. What are my options for HRT?  HRT may consist of the synthetic hormones estrogen and progestin, or it may consist of only estrogen (estrogen-only therapy). You and your health care provider will decide which form of HRT is best for you. If you choose to be on HRT and you have a uterus, estrogen and progestin are usually prescribed. Estrogen-only therapy is used for women who do not have a uterus. Possible options for taking HRT include:  Pills.  Patches.  Gels.  Sprays.  Vaginal cream.  Vaginal rings.  Vaginal inserts. The amount of hormone(s) that you take and how long you take the hormone(s) varies according to your health. It is important to:  Begin HRT with the lowest possible dosage.  Stop HRT as soon as your health care provider tells you to stop.  Work with your health care provider so that you feel informed and comfortable with your decisions. What are the benefits of HRT? HRT can reduce the frequency and severity of menopausal symptoms. Benefits of HRT vary according to the kind of symptoms that you have, how severe they are, and your overall health. HRT may help to improve the following symptoms of menopause:  Hot flashes and night sweats. These are sudden feelings of heat that spread over the face and body. The skin may turn red, like a blush. Night sweats are hot flashes that happen while you are sleeping or trying to sleep.  Bone loss (osteoporosis). The  body loses calcium more quickly after menopause, causing the bones to become weaker. This can increase the risk for bone breaks (fractures).  Vaginal dryness. The lining of the vagina can become thin and dry, which can cause pain during sex or cause infection, burning, or itching.  Urinary tract infections.  Urinary incontinence. This is the inability to control when you pass urine.  Irritability.  Short-term memory problems. What are the risks of HRT? Risks of HRT vary depending on your individual health and medical history. Risks of HRT also depend on whether you receive both estrogen and progestin or you receive estrogen only. HRT may increase the risk of:  Spotting. This is when a small amount of blood leaks from the vagina unexpectedly.  Endometrial cancer. This cancer is in the lining of the uterus (endometrium).  Breast cancer.  Increased density of breast tissue. This can make it harder to find breast cancer on a breast X-ray (mammogram).  Stroke.  Heart disease.  Blood clots.  Gallbladder disease.  Liver disease. Risks of HRT can increase if you have any of the following conditions:  Endometrial cancer.  Liver disease.  Heart disease.  Breast cancer.  History of blood clots.  History of stroke. Follow these instructions at home:  Take over-the-counter and prescription medicines only as told by your health care provider.  Get mammograms, pelvic exams, and medical checkups as often as told by your health care provider.  Have Pap tests done as often as told by your health care provider. A Pap test is sometimes called a Pap smear. It   is a screening test that is used to check for signs of cancer of the cervix and vagina. A Pap test can also identify the presence of infection or precancerous changes. Pap tests may be done: ? Every 3 years, starting at age 21. ? Every 5 years, starting after age 30, in combination with testing for human papillomavirus (HPV). ?  More often or less often depending on other medical conditions you have, your age, and other risk factors.  It is up to you to get the results of your Pap test. Ask your health care provider, or the department that is doing the test, when your results will be ready.  Keep all follow-up visits as told by your health care provider. This is important. Contact a health care provider if you have:  Pain or swelling in your legs.  Shortness of breath.  Chest pain.  Lumps or changes in your breasts or armpits.  Slurred speech.  Pain, burning, or bleeding when you urinate.  Unusual vaginal bleeding.  Dizziness or headaches.  Weakness or numbness in any part of your arms or legs.  Pain in your abdomen. Summary  Menopause is a normal time of life when menstrual periods stop completely and the ovaries stop producing the female hormones estrogen and progesterone.  Hormone replacement therapy (HRT) can relieve some of the symptoms of menopause.  HRT can reduce the frequency and severity of menopausal symptoms.  Risks of HRT vary depending on your individual health and medical history. This information is not intended to replace advice given to you by your health care provider. Make sure you discuss any questions you have with your health care provider. Document Revised: 11/26/2017 Document Reviewed: 11/26/2017 Elsevier Patient Education  2020 [employer] Health Maintenance for Postmenopausal Women Menopause is a normal process in which your ability to get pregnant comes to an end. This process happens slowly over many months or years, usually between the ages of 48 and 55. Menopause is complete when you have missed your menstrual periods for 12 months. It is important to talk with your health care provider about some of the most common conditions that affect women after menopause (postmenopausal women). These include heart disease, cancer, and bone loss (osteoporosis). Adopting a healthy  lifestyle and getting preventive care can help to promote your health and wellness. The actions you take can also lower your chances of developing some of these common conditions. What should I know about menopause? During menopause, you may get a number of symptoms, such as:  Hot flashes. These can be moderate or severe.  Night sweats.  Decrease in sex drive.  Mood swings.  Headaches.  Tiredness.  Irritability.  Memory problems.  Insomnia. Choosing to treat or not to treat these symptoms is a decision that you make with your health care provider. Do I need hormone replacement therapy?  Hormone replacement therapy is effective in treating symptoms that are caused by menopause, such as hot flashes and night sweats.  Hormone replacement carries certain risks, especially as you become older. If you are thinking about using estrogen or estrogen with progestin, discuss the benefits and risks with your health care provider. What is my risk for heart disease and stroke? The risk of heart disease, heart attack, and stroke increases as you age. One of the causes may be a change in the body's hormones during menopause. This can affect how your body uses dietary fats, triglycerides, and cholesterol. Heart attack and stroke are medical emergencies. There are   many things that you can do to help prevent heart disease and stroke. Watch your blood pressure  High blood pressure causes heart disease and increases the risk of stroke. This is more likely to develop in people who have high blood pressure readings, are of African descent, or are overweight.  Have your blood pressure checked: ? Every 3-5 years if you are 18-39 years of age. ? Every year if you are 40 years old or older. Eat a healthy diet   Eat a diet that includes plenty of vegetables, fruits, low-fat dairy products, and lean protein.  Do not eat a lot of foods that are high in solid fats, added sugars, or sodium. Get regular  exercise Get regular exercise. This is one of the most important things you can do for your health. Most adults should:  Try to exercise for at least 150 minutes each week. The exercise should increase your heart rate and make you sweat (moderate-intensity exercise).  Try to do strengthening exercises at least twice each week. Do these in addition to the moderate-intensity exercise.  Spend less time sitting. Even light physical activity can be beneficial. Other tips  Work with your health care provider to achieve or maintain a healthy weight.  Do not use any products that contain nicotine or tobacco, such as cigarettes, e-cigarettes, and chewing tobacco. If you need help quitting, ask your health care provider.  Know your numbers. Ask your health care provider to check your cholesterol and your blood sugar (glucose). Continue to have your blood tested as directed by your health care provider. Do I need screening for cancer? Depending on your health history and family history, you may need to have cancer screening at different stages of your life. This may include screening for:  Breast cancer.  Cervical cancer.  Lung cancer.  Colorectal cancer. What is my risk for osteoporosis? After menopause, you may be at increased risk for osteoporosis. Osteoporosis is a condition in which bone destruction happens more quickly than new bone creation. To help prevent osteoporosis or the bone fractures that can happen because of osteoporosis, you may take the following actions:  If you are 19-50 years old, get at least 1,000 mg of calcium and at least 600 mg of vitamin D per day.  If you are older than age 50 but younger than age 70, get at least 1,200 mg of calcium and at least 600 mg of vitamin D per day.  If you are older than age 70, get at least 1,200 mg of calcium and at least 800 mg of vitamin D per day. Smoking and drinking excessive alcohol increase the risk of osteoporosis. Eat foods that  are rich in calcium and vitamin D, and do weight-bearing exercises several times each week as directed by your health care provider. How does menopause affect my mental health? Depression may occur at any age, but it is more common as you become older. Common symptoms of depression include:  Low or sad mood.  Changes in sleep patterns.  Changes in appetite or eating patterns.  Feeling an overall lack of motivation or enjoyment of activities that you previously enjoyed.  Frequent crying spells. Talk with your health care provider if you think that you are experiencing depression. General instructions See your health care provider for regular wellness exams and vaccines. This may include:  Scheduling regular health, dental, and eye exams.  Getting and maintaining your vaccines. These include: ? Influenza vaccine. Get this vaccine each year before   the flu season begins. ? Pneumonia vaccine. ? Shingles vaccine. ? Tetanus, diphtheria, and pertussis (Tdap) booster vaccine. Your health care provider may also recommend other immunizations. Tell your health care provider if you have ever been abused or do not feel safe at home. Summary  Menopause is a normal process in which your ability to get pregnant comes to an end.  This condition causes hot flashes, night sweats, decreased interest in sex, mood swings, headaches, or lack of sleep.  Treatment for this condition may include hormone replacement therapy.  Take actions to keep yourself healthy, including exercising regularly, eating a healthy diet, watching your weight, and checking your blood pressure and blood sugar levels.  Get screened for cancer and depression. Make sure that you are up to date with all your vaccines. This information is not intended to replace advice given to you by your health care provider. Make sure you discuss any questions you have with your health care provider. Document Revised: 03/19/2018 Document  Reviewed: 03/19/2018 Elsevier Patient Education  2020 [employer]  

## 2019-12-09 NOTE — Progress Notes (Signed)
   Jill Barry March 02, 1961 [id number]   History:  59 y.o. G2P0012 presents as new patient to establish care. Moved here 5 months ago from [place]. Postmenopausal - on hormone replacement therapy since 2013 for management of bone pain, mood changes, and hot flashes. Normal pap and mammogram history. Was seen by Primary care with complaints of breast pain, diagnostic mammogram was normal. Husband present for part of visit.  Gynecologic History No LMP recorded. Patient is postmenopausal.   Last Pap: UNK  Last mammogram: 12/02/2019. Results were: normal Last colonoscopy: 2-3 years ago. Results were: normal Last Dexa: 02/29/2016. Results were: normal  Past medical history, past surgical history, family history and social history were all reviewed and documented in the EPIC chart.  ROS:  A ROS was performed and pertinent positives and negatives are included.  Exam:  Vitals:   12/09/19 1058  BP: 122/80  Weight: 129 lb (58.5 kg)  Height: 5\' 2"  (1.575 m)   Body mass index is 23.59 kg/m.  General appearance:  Normal Thyroid:  Symmetrical, normal in size, without palpable masses or nodularity. Respiratory  Auscultation:  Clear without wheezing or rhonchi Cardiovascular  Auscultation:  Regular rate, without rubs, murmurs or gallops. Occasional PVCs  Edema/varicosities:  Not grossly evident Abdominal  Soft,nontender, without masses, guarding or rebound.  Liver/spleen:  No organomegaly noted  Hernia:  None appreciated  Skin  Inspection:  Grossly normal   Breasts: Examined lying and sitting.   Right: Without masses, retractions, discharge or axillary adenopathy.   Left: Without masses, retractions, discharge or axillary adenopathy. Gentitourinary   Inguinal/mons:  Normal without inguinal adenopathy  External genitalia:  Normal  BUS/Urethra/Skene's glands:  Normal  Vagina:  Normal  Cervix:  Normal  Uterus:  Anteverted, normal in size, shape and contour.  Midline and  mobile  Adnexa/parametria:     Rt: Without masses or tenderness.   Lt: Without masses or tenderness.  Anus and perineum: Normal   Assessment/Plan:  59 y.o. 46 as new patient.    Well female exam with routine gynecological exam - Education provided on SBEs, importance of preventative screenings, current guidelines, high calcium diet, regular exercise, and multivitamin daily.   Hormone replacement therapy - Plan: estradiol (CLIMARA - DOSED IN MG/24 HR) 0.0375 mg/24hr patch, progesterone (PROMETRIUM) 100 MG capsule. We discussed the risks for blood clots, stroke, heart attack, and breast cancer. Her and her husband are aware and would like to continue. Refill x 1 year provided. We will reevaluate next year. She gets her annual mammograms.  Encounter for Papanicolaou smear for cervical cancer screening - Plan: PAP,TP IMGw/HPV RNA,rflx HPVTYPE16,18/45   Follow up in 1 year for annual      [id number] [facility], 12:08 PM 12/09/2019

## 2019-12-10 LAB — PAP, TP IMAGING W/ HPV RNA, RFLX HPV TYPE 16,18/45: HPV DNA High Risk: NOT DETECTED

## 2019-12-16 ENCOUNTER — Ambulatory Visit: Payer: 59 | Admitting: Nurse Practitioner

## 2019-12-22 MED FILL — PROGESTERONE 100 MG CAPSULE: 100 | 30 days supply | Qty: 30 | Fill #4

## 2019-12-22 MED FILL — ESTRADIOL TDS 0.0375 MG/DAY: 0.0375 | 28 days supply | Qty: 4 | Fill #4

## 2020-01-11 ENCOUNTER — Telehealth: Payer: Self-pay | Admitting: Family Medicine

## 2020-01-11 DIAGNOSIS — R002 Palpitations: Secondary | ICD-10-CM

## 2020-01-11 NOTE — Telephone Encounter (Signed)
Ok to put in referral to cardiology for palpitations.

## 2020-01-11 NOTE — Telephone Encounter (Signed)
Pt's husband was notified.  

## 2020-01-11 NOTE — Telephone Encounter (Signed)
Pt's spouse called. He states he would like pt to have a referral to Cardiology due to her history. Please advise pt at [phone].

## 2020-01-19 MED FILL — ESTRADIOL TDS 0.0375 MG/DAY: 0.0375 | 84 days supply | Qty: 12 | Fill #0

## 2020-01-19 MED FILL — PROGESTERONE 100 MG CAPSULE: 100 | 90 days supply | Qty: 90 | Fill #0

## 2020-01-22 ENCOUNTER — Encounter: Payer: Self-pay | Admitting: Family Medicine

## 2020-02-29 NOTE — Progress Notes (Signed)
Cardiology Office Note:    Date:  03/02/2020   ID:  Burman Riis, DOB [birth year], MRN [id number]  PCP:  Avanell Shackleton, NP-C  [facility] Cardiologist:  No primary care provider on file.  [facility] Electrophysiologist:  None   Referring MD: Avanell Shackleton, NP-C    History of Present Illness:    Jill Barry is a 59 y.o. female with a hx of asthma who was referred by Hetty Blend, NP for further evaluation of a palptiations  Patient states that she has had palpitations with associated chest discomfort that started last April 2020. She saw her PCP (husband is also a hospitalist currently and was a Development worker, international aid in [place]) and underwent cardiac monitor with frequent PVCs, no Afib, no SVT. Did trial of beta blocker but her symptoms persisted and it caused worsening abdominal pain. Also tried potassium and magnesium supplementation, which also irritated her stomach. She notes that she was diagnosed with gastritis and was placed on PPI x3 months with improvement. She continues to have PVCs but they have overall improved since she completed treatment for her gastritis. She notes that the PVCs are worse after eating and improve with laying back. Not worse with exertion. No fevers, chills,  history of COVID,  thyroid issues. No lightheadedness, dizziness, syncope.   No family history of CAD, SCD, cardiomyopathies. She is on HRT but she has horrible joint pain if she stops taking it.  Past Medical History:  Diagnosis Date  . Arthritis   . Asthma   . Heart murmur   . Irregular heart beat     Past Surgical History:  Procedure Laterality Date  . NOSE SURGERY  2017  . UPPER GASTROINTESTINAL ENDOSCOPY  10/01/2019    Current Medications: No outpatient medications have been marked as taking for the 03/02/20 encounter (Office Visit) with Meriam Sprague, MD.     Allergies:   Patient has no known allergies.   Social History   Socioeconomic History  . Marital status: Married     Spouse name: Not on file  . Number of children: 2  . Years of education: Not on file  . Highest education level: Not on file  Occupational History  . Occupation: home maker   Tobacco Use  . Smoking status: Never Smoker  . Smokeless tobacco: Never Used  Vaping Use  . Vaping Use: Never used  Substance and Sexual Activity  . Alcohol use: Never  . Drug use: Never  . Sexual activity: Yes    Partners: Male  Other Topics Concern  . Not on file  Social History Narrative  . Not on file   Social Determinants of Health   Financial Resource Strain:   . Difficulty of Paying Living Expenses: Not on file  Food Insecurity:   . Worried About Programme researcher, broadcasting/film/video in the Last Year: Not on file  . Ran Out of Food in the Last Year: Not on file  Transportation Needs:   . Lack of Transportation (Medical): Not on file  . Lack of Transportation (Non-Medical): Not on file  Physical Activity:   . Days of Exercise per Week: Not on file  . Minutes of Exercise per Session: Not on file  Stress:   . Feeling of Stress : Not on file  Social Connections:   . Frequency of Communication with Friends and Family: Not on file  . Frequency of Social Gatherings with Friends and Family: Not on file  . Attends Religious Services: Not on file  .  Active Member of Clubs or Organizations: Not on file  . Attends Banker Meetings: Not on file  . Marital Status: Not on file     Family History: The patient's family history includes Cervical cancer in her sister; Diabetes in her father; Hyperlipidemia in her father; Hypertension in her father. There is no history of Colon cancer, Colon polyps, Esophageal cancer, Rectal cancer, or Stomach cancer.  ROS:   Please see the history of present illness.    Review of Systems  Constitutional: Negative for chills, fever and weight loss.  HENT: Negative for ear pain.   Eyes: Negative for blurred vision.  Respiratory: Negative for shortness of breath.     Cardiovascular: Positive for palpitations. Negative for chest pain, orthopnea, claudication, leg swelling and PND.  Gastrointestinal: Negative for heartburn, nausea and vomiting.  Genitourinary: Negative for hematuria.  Musculoskeletal: Negative for falls.  Neurological: Negative for dizziness and loss of consciousness.  Endo/Heme/Allergies: Negative for polydipsia.  Psychiatric/Behavioral: Negative for substance abuse.    EKGs/Labs/Other Studies Reviewed:    The following studies were reviewed today: TTE 10-02-19: 1. Left ventricular ejection fraction, by estimation, is 60 to 65%. The  left ventricle has normal function. The left ventricle has no regional  wall motion abnormalities. Left ventricular diastolic parameters are  consistent with Grade I diastolic  dysfunction (impaired relaxation).  2. Right ventricular systolic function is normal. The right ventricular  size is normal. There is normal pulmonary artery systolic pressure.  3. The mitral valve is normal in structure. Trivial mitral valve  regurgitation. No evidence of mitral stenosis.  4. The aortic valve is normal in structure. Aortic valve regurgitation is  not visualized. No aortic stenosis is present.  5. The inferior vena cava is normal in size with greater than 50%  respiratory variability, suggesting right atrial pressure of 3 mmHg.   EKG:  EKG is  ordered today.  The ekg ordered today demonstrates NSR with HR 71 with PVC  Recent Labs: 09/14/2019: ALT 17; BUN 10; Creatinine, Ser 0.69; Hemoglobin 14.8; Platelets 225.0; Potassium 3.9; Sodium 137 09/18/2019: TSH 2.400  Recent Lipid Panel    Component Value Date/Time   CHOL 242 (H) 09/18/2019 1046   TRIG 179 (H) 09/18/2019 1046   HDL 64 09/18/2019 1046   CHOLHDL 3.8 09/18/2019 1046   LDLCALC 146 (H) 09/18/2019 1046     Physical Exam:    VS:  BP (!) 142/80   Pulse 71   Ht 5\' 2"  (1.575 m)   Wt 131 lb (59.4 kg)   SpO2 98%   BMI 23.96 kg/m     Wt  Readings from Last 3 Encounters:  03/02/20 131 lb (59.4 kg)  12/09/19 129 lb (58.5 kg)  10/01/19 129 lb 12.8 oz (58.9 kg)     GEN:  Well nourished, well developed in no acute distress HEENT: Normal NECK: No JVD; No carotid bruits CARDIAC: RRR, no murmurs, rubs, gallops RESPIRATORY:  Clear to auscultation without rales, wheezing or rhonchi  ABDOMEN: Soft, non-tender, non-distended MUSCULOSKELETAL:  No edema; No deformity  SKIN: Warm and dry NEUROLOGIC:  Alert and oriented x 3 PSYCHIATRIC:  Normal affect   ASSESSMENT:    1. PVC's (premature ventricular contractions)   2. Palpitations    PLAN:    In order of problems listed above:  #Palpitations: #Frequent PVCs: Patient with known history of symptomatic PVCs. Has tried BB, magnesium and potassium supplementation without significant improvement. Symptoms worsened with gastritis and eating. Improved from initial evaluation  in 2020 after her gastritis was treated but continue to occur daily. TTE with normal BiV function without significant valvular abnormalities. Patient does not want to try BB or CCB again as they did not work in the past. She is interested in conservative measures first prior to proceeding with EP evaluation. -Check zio patch x7 days to assess burden of PVCs -Recommend continued treatment of gastritis as patient appears to be most symptomatic after meals -TTE with normal BiV function; reassured patient that it is okay to resume regular activity as symptoms not worse with exertion -Trial of Mg at night; will not resume potassium as caused significant stomach upset -Patient declined BB and CCB as did not work in the past -If conservative measures fail and patient remains severely symptomatic, can refer to EP for possible ablation -Wants to stay on HRT due to severe joint pain if it stops   Medication Adjustments/Labs and Tests Ordered: Current medicines are reviewed at length with the patient today.  Concerns  regarding medicines are outlined above.  Orders Placed This Encounter  Procedures  . LONG TERM MONITOR (3-14 DAYS)  . EKG 12-Lead   No orders of the defined types were placed in this encounter.   Patient Instructions  Medication Instructions:  No changes *If you need a refill on your cardiac medications before your next appointment, please call your pharmacy*   Lab Work: none If you have labs (blood work) drawn today and your tests are completely normal, you will receive your results only by: Marland Kitchen. MyChart Message (if you have MyChart) OR . A paper copy in the mail If you have any lab test that is abnormal or we need to change your treatment, we will call you to review the results.   Testing/Procedures: Christena DeemZIO XT- Long Term Monitor Instructions   Your physician has requested you wear your ZIO patch monitor for 7 days.   This is a single patch monitor.  [employer] supplies one patch monitor per enrollment.  Additional stickers are not available.   Please do not apply patch if you will be having a Nuclear Stress Test, Echocardiogram, Cardiac CT, MRI, or Chest Xray during the time frame you would be wearing the monitor. The patch cannot be worn during these tests.  You cannot remove and re-apply the ZIO XT patch monitor.   Your ZIO patch monitor will be sent USPS Priority mail from [facility] directly to your home address. The monitor may also be mailed to a PO BOX if home delivery is not available.   It may take 3-5 days to receive your monitor after you have been enrolled.   Once you have received you monitor, please review enclosed instructions.  Your monitor has already been registered assigning a specific monitor serial # to you.   Applying the monitor  Shave hair from upper left chest.  Hold abrader disc by orange tab.  Rub abrader in 40 strokes over left upper chest as indicated in your monitor instructions.   Clean area with 4 enclosed alcohol pads .  Use all pads to  assure are is cleaned thoroughly.  Let dry.   Apply patch as indicated in monitor instructions.  Patch will be place under collarbone on left side of chest with arrow pointing upward.   Rub patch adhesive wings for 2 minutes.Remove white label marked "1".  Remove white label marked "2".  Rub patch adhesive wings for 2 additional minutes.   While looking in a mirror, press and release button  in center of patch.  A small green light will flash 3-4 times .  This will be your only indicator the monitor has been turned on.     Do not shower for the first 24 hours.  You may shower after the first 24 hours.   Press button if you feel a symptom. You will hear a small click.  Record Date, Time and Symptom in the Patient Log Book.   When you are ready to remove patch, follow instructions on last 2 pages of Patient Log Book.  Stick patch monitor onto last page of Patient Log Book.   Place Patient Log Book in [place] box.  Use locking tab on box and tape box closed securely.  The [employer] and [employer] has [employer] on it.  Please place in mailbox as soon as possible.  Your physician should have your test results approximately 7 days after the monitor has been mailed back to [facility].   Call [facility] Customer Care at [phone] if you have questions regarding your ZIO XT patch monitor.  Call them immediately if you see an orange light blinking on your monitor.   If your monitor falls off in less than 4 days contact our Monitor department at [phone].  If your monitor becomes loose or falls off after 4 days call [employer] at [phone] for suggestions on securing your monitor.     Follow-Up: At [facility], you and your health needs are our priority.  As part of our continuing mission to provide you with exceptional heart care, we have created designated Provider Care Teams.  These Care Teams include your primary Cardiologist (physician) and Advanced Practice Providers (APPs -   Physician Assistants and Nurse Practitioners) who all work together to provide you with the care you need, when you need it.   Your next appointment:   2 month(s)  The format for your next appointment:   In Person  Provider:   Laurance Flatten, MD   Other Instructions      Signed, Meriam Sprague, MD  03/02/2020 1:03 PM    [facility]

## 2020-03-02 ENCOUNTER — Ambulatory Visit (INDEPENDENT_AMBULATORY_CARE_PROVIDER_SITE_OTHER): Payer: 59 | Admitting: Cardiology

## 2020-03-02 ENCOUNTER — Telehealth: Payer: Self-pay | Admitting: Radiology

## 2020-03-02 ENCOUNTER — Encounter: Payer: Self-pay | Admitting: Cardiology

## 2020-03-02 ENCOUNTER — Other Ambulatory Visit: Payer: Self-pay

## 2020-03-02 VITALS — BP 142/80 | HR 71 | Ht 62.0 in | Wt 131.0 lb

## 2020-03-02 DIAGNOSIS — I493 Ventricular premature depolarization: Secondary | ICD-10-CM | POA: Diagnosis not present

## 2020-03-02 DIAGNOSIS — R002 Palpitations: Secondary | ICD-10-CM

## 2020-03-02 NOTE — Telephone Encounter (Signed)
Enrolled patient for a 7 day Zio XT Monitor to be mailed to patients home.  

## 2020-03-02 NOTE — Patient Instructions (Signed)
Medication Instructions:  No changes *If you need a refill on your cardiac medications before your next appointment, please call your pharmacy*   Lab Work: none If you have labs (blood work) drawn today and your tests are completely normal, you will receive your results only by: Marland Kitchen MyChart Message (if you have MyChart) OR . A paper copy in the mail If you have any lab test that is abnormal or we need to change your treatment, we will call you to review the results.   Testing/Procedures: Christena Deem- Long Term Monitor Instructions   Your physician has requested you wear your ZIO patch monitor for 7 days.   This is a single patch monitor.  [employer] supplies one patch monitor per enrollment.  Additional stickers are not available.   Please do not apply patch if you will be having a Nuclear Stress Test, Echocardiogram, Cardiac CT, MRI, or Chest Xray during the time frame you would be wearing the monitor. The patch cannot be worn during these tests.  You cannot remove and re-apply the ZIO XT patch monitor.   Your ZIO patch monitor will be sent USPS Priority mail from [facility] directly to your home address. The monitor may also be mailed to a PO BOX if home delivery is not available.   It may take 3-5 days to receive your monitor after you have been enrolled.   Once you have received you monitor, please review enclosed instructions.  Your monitor has already been registered assigning a specific monitor serial # to you.   Applying the monitor  Shave hair from upper left chest.  Hold abrader disc by orange tab.  Rub abrader in 40 strokes over left upper chest as indicated in your monitor instructions.   Clean area with 4 enclosed alcohol pads .  Use all pads to assure are is cleaned thoroughly.  Let dry.   Apply patch as indicated in monitor instructions.  Patch will be place under collarbone on left side of chest with arrow pointing upward.   Rub patch adhesive wings for 2  minutes.Remove white label marked "1".  Remove white label marked "2".  Rub patch adhesive wings for 2 additional minutes.   While looking in a mirror, press and release button in center of patch.  A small green light will flash 3-4 times .  This will be your only indicator the monitor has been turned on.     Do not shower for the first 24 hours.  You may shower after the first 24 hours.   Press button if you feel a symptom. You will hear a small click.  Record Date, Time and Symptom in the Patient Log Book.   When you are ready to remove patch, follow instructions on last 2 pages of Patient Log Book.  Stick patch monitor onto last page of Patient Log Book.   Place Patient Log Book in [place] box.  Use locking tab on box and tape box closed securely.  The [employer] has [employer] on it.  Please place in mailbox as soon as possible.  Your physician should have your test results approximately 7 days after the monitor has been mailed back to [facility].   Call [facility] Customer Care at [phone] if you have questions regarding your ZIO XT patch monitor.  Call them immediately if you see an orange light blinking on your monitor.   If your monitor falls off in less than 4 days contact our Monitor department  at [phone].  If your monitor becomes loose or falls off after 4 days call [employer] at [phone] for suggestions on securing your monitor.     Follow-Up: At [facility], you and your health needs are our priority.  As part of our continuing mission to provide you with exceptional heart care, we have created designated Provider Care Teams.  These Care Teams include your primary Cardiologist (physician) and Advanced Practice Providers (APPs -  Physician Assistants and Nurse Practitioners) who all work together to provide you with the care you need, when you need it.   Your next appointment:   2 month(s)  The format for your next appointment:   In  Person  Provider:   Laurance Flatten, MD   Other Instructions

## 2020-03-07 ENCOUNTER — Other Ambulatory Visit (INDEPENDENT_AMBULATORY_CARE_PROVIDER_SITE_OTHER): Payer: 59

## 2020-03-07 DIAGNOSIS — I493 Ventricular premature depolarization: Secondary | ICD-10-CM | POA: Diagnosis not present

## 2020-03-24 DIAGNOSIS — I493 Ventricular premature depolarization: Secondary | ICD-10-CM | POA: Diagnosis not present

## 2020-03-29 ENCOUNTER — Other Ambulatory Visit: Payer: Self-pay | Admitting: *Deleted

## 2020-03-29 DIAGNOSIS — I493 Ventricular premature depolarization: Secondary | ICD-10-CM

## 2020-03-29 NOTE — Progress Notes (Signed)
EP referral

## 2020-04-07 MED FILL — ESTRADIOL TDS 0.0375 MG/DAY: 0.0375 | 84 days supply | Qty: 12 | Fill #1

## 2020-04-07 MED FILL — PROGESTERONE 100 MG CAPSULE: 100 | 90 days supply | Qty: 90 | Fill #1

## 2020-04-15 ENCOUNTER — Institutional Professional Consult (permissible substitution): Payer: 59 | Admitting: Internal Medicine

## 2020-05-19 ENCOUNTER — Other Ambulatory Visit: Payer: Self-pay | Admitting: Gastroenterology

## 2020-05-19 ENCOUNTER — Other Ambulatory Visit (INDEPENDENT_AMBULATORY_CARE_PROVIDER_SITE_OTHER): Payer: 59

## 2020-05-19 ENCOUNTER — Ambulatory Visit (INDEPENDENT_AMBULATORY_CARE_PROVIDER_SITE_OTHER): Payer: 59 | Admitting: Gastroenterology

## 2020-05-19 ENCOUNTER — Encounter: Payer: Self-pay | Admitting: Gastroenterology

## 2020-05-19 VITALS — BP 152/80 | HR 80 | Ht 61.32 in | Wt 131.0 lb

## 2020-05-19 DIAGNOSIS — K219 Gastro-esophageal reflux disease without esophagitis: Secondary | ICD-10-CM | POA: Diagnosis not present

## 2020-05-19 DIAGNOSIS — K449 Diaphragmatic hernia without obstruction or gangrene: Secondary | ICD-10-CM

## 2020-05-19 DIAGNOSIS — R1013 Epigastric pain: Secondary | ICD-10-CM

## 2020-05-19 LAB — COMPREHENSIVE METABOLIC PANEL
ALT: 16 U/L (ref 0–35)
AST: 16 U/L (ref 0–37)
Albumin: 4.6 g/dL (ref 3.5–5.2)
Alkaline Phosphatase: 81 U/L (ref 39–117)
BUN: 15 mg/dL (ref 6–23)
CO2: 30 mEq/L (ref 19–32)
Calcium: 9.5 mg/dL (ref 8.4–10.5)
Chloride: 101 mEq/L (ref 96–112)
Creatinine, Ser: 0.78 mg/dL (ref 0.40–1.20)
GFR: 83.06 mL/min (ref >60.00)
Glucose, Bld: 106 mg/dL — ABNORMAL HIGH (ref 70–99)
Potassium: 3.7 mEq/L (ref 3.5–5.1)
Sodium: 139 mEq/L (ref 135–145)
Total Bilirubin: 0.6 mg/dL (ref 0.2–1.2)
Total Protein: 7.3 g/dL (ref 6.0–8.3)

## 2020-05-19 LAB — LIPASE: Lipase: 19 U/L (ref 11.0–59.0)

## 2020-05-19 LAB — AMYLASE: Amylase: 42 U/L (ref 27–131)

## 2020-05-19 MED ORDER — DICYCLOMINE HCL 10 MG PO CAPS: 10.0000 mg | ORAL_CAPSULE | Freq: Four times a day (QID) | ORAL | 3 refills | Status: DC

## 2020-05-19 MED ORDER — ESOMEPRAZOLE MAGNESIUM 40 MG PO CPDR: 40.0000 mg | DELAYED_RELEASE_CAPSULE | Freq: Every day | ORAL | 3 refills | Status: DC

## 2020-05-19 MED FILL — DICYCLOMINE 10 MG CAPSULE: 10 | 30 days supply | Qty: 120 | Fill #0

## 2020-05-19 MED FILL — ESOMEPRAZOLE MAG DR 40 MG C: 40 | 90 days supply | Qty: 90 | Fill #0

## 2020-05-19 NOTE — Patient Instructions (Addendum)
Your provider has requested that you go to the basement level for lab work before leaving today. Press "B" on the elevator. The lab is located at the first door on the left as you exit the elevator.  We have sent the following medications to your pharmacy for you to pick up at your convenience:  Nexium, Dicyclomine  You have been scheduled for a CT scan of the abdomen and pelvis at [facility], 1st floor Radiology. You are scheduled on 05/25/20  at 5:00pm. You should arrive 15 minutes prior to your appointment time for registration.  Please pick up 2 bottles of contrast from [place] at least 3 days prior to your scan. The solution may taste better if refrigerated, but do NOT add ice or any other liquid to this solution. Shake well before drinking.   Please follow the written instructions below on the day of your exam:   1) Do not eat anything after 1:00pm (4 hours prior to your test)   2) Drink 1 bottle of contrast @ 3:00pm (2 hours prior to your exam)  Remember to shake well before drinking and do NOT pour over ice.     Drink 1 bottle of contrast @ 4:00pm (1 hour prior to your exam)   You may take any medications as prescribed with a small amount of water, if necessary. If you take any of the following medications: METFORMIN, GLUCOPHAGE, GLUCOVANCE, AVANDAMET, RIOMET, FORTAMET, Gould MET, JANUMET, GLUMETZA or METAGLIP, you MAY be asked to HOLD this medication 48 hours AFTER the exam.   The purpose of you drinking the oral contrast is to aid in the visualization of your intestinal tract. The contrast solution may cause some diarrhea. Depending on your individual set of symptoms, you may also receive an intravenous injection of x-ray contrast/dye. Plan on being at [facility] for 45 minutes or longer, depending on the type of exam you are having performed.   If you have any questions regarding your exam or if you need to reschedule, you may call [facility] at [phone]  between the hours of 8:00 am and 5:00 pm, Monday-Friday.   Please follow up on 07/12/2020 at 8:10am

## 2020-05-19 NOTE — Progress Notes (Signed)
Jill Barry    [id number]    Feb 04, 1961  Primary Care Physician:Barry, Jill Pang, NP-C  Referring Physician: Avanell Shackleton, NP-C [address] [place],  [place] [address]   Chief complaint: Epigastric pain  HPI:  [name] is a very pleasant 60 year old female here for follow-up visit for epigastric abdominal pain.  She is having abdominal discomfort and pain almost every time she eats and sometimes even when she drinks water.  She tried Prevacid and also over-the-counter Nexium with no significant improvement.  She denies any heartburn or regurgitation. She was tearful but she does not want to go to [place] to help her mother and sister when she is not feeling better.  Her father recently passed away from stomach cancer and her sister is diagnosed with cancer. Denies any dysphagia, melena or rectal bleeding.  No weight loss.  EGD 10/01/19 - LA Grade B (one or more mucosal breaks greater than 5 mm, not extending between the tops of two mucosal folds) esophagitis with no bleeding was found 34 to 36 cm from the incisors. - A small hiatal hernia was present. - Patchy mild inflammation characterized by congestion (edema) and erythema was found in the entire examined stomach. Biopsies were taken with a cold forceps for Helicobacter pylori testing. - Patchy mildly erythematous mucosa was found in the duodenal bulb. Biopsies for histology were taken with a cold forceps for evaluation of celiac disease. - The exam was otherwise without abnormality.  1. Surgical [P], duodenal bulb, 2nd portion of duodenum and distal duodenum - BENIGN SMALL BOWEL MUCOSA. - NO VILLOUS BLUNTING OR INCREASE IN INTRAEPITHELIAL LYMPHOCYTES. - NO DYSPLASIA OR MALIGNANCY. 2. Surgical [P], gastric antrum and gastric body - MILD REACTIVE CHANGES. - WARTHIN-STARRY IS NEGATIVE FOR HELICOBACTER PYLORI. - NO INTESTINAL METAPLASIA, DYSPLASIA, OR MALIGNANCY.   Outpatient Encounter Medications as of  05/19/2020  Medication Sig  . albuterol (VENTOLIN HFA) 108 (90 Base) MCG/ACT inhaler Inhale 2 puffs into the lungs every 6 (six) hours as needed for wheezing or shortness of breath.  . Calcium Carbonate (CALCIUM 500 PO) Take by mouth.  . estradiol (CLIMARA - DOSED IN MG/24 HR) 0.0375 mg/24hr patch Place 1 patch (0.0375 mg total) onto the skin once a week.  . fluticasone-salmeterol (ADVAIR HFA) 230-21 MCG/ACT inhaler Inhale 2 puffs into the lungs 2 (two) times daily.  . Glucos-Chondroit-Hyaluron-MSM (GLUCOSAMINE CHONDROITIN JOINT PO) Take 1 tablet by mouth daily.  . progesterone (PROMETRIUM) 100 MG capsule Take 1 capsule (100 mg total) by mouth daily.  . [DISCONTINUED] Misc Natural Products (GLUCOSAMINE CHONDROITIN ADV PO) Take by mouth.   No facility-administered encounter medications on file as of 05/19/2020.    Allergies as of 05/19/2020  . (No Known Allergies)    Past Medical History:  Diagnosis Date  . Arthritis   . Asthma   . Heart murmur   . Irregular heart beat   . PVC's (premature ventricular contractions)     Past Surgical History:  Procedure Laterality Date  . NOSE SURGERY  2017  . UPPER GASTROINTESTINAL ENDOSCOPY  10/01/2019    Family History  Problem Relation Age of Onset  . Hypertension Father   . Diabetes Father   . Hyperlipidemia Father   . Cervical cancer Sister        Stage IV on chemo  . Colon cancer Neg Hx   . Colon polyps Neg Hx   . Esophageal cancer Neg Hx   . Rectal cancer  Neg Hx   . Stomach cancer Neg Hx     Social History   Socioeconomic History  . Marital status: Married    Spouse name: Not on file  . Number of children: 2  . Years of education: Not on file  . Highest education level: Not on file  Occupational History  . Occupation: home maker   Tobacco Use  . Smoking status: Never Smoker  . Smokeless tobacco: Never Used  Vaping Use  . Vaping Use: Never used  Substance and Sexual Activity  . Alcohol use: Never  . Drug use: Never   . Sexual activity: Yes    Partners: Male  Other Topics Concern  . Not on file  Social History Narrative  . Not on file   Social Determinants of Health   Financial Resource Strain: Not on file  Food Insecurity: Not on file  Transportation Needs: Not on file  Physical Activity: Not on file  Stress: Not on file  Social Connections: Not on file  Intimate Partner Violence: Not on file      Review of systems: All other review of systems negative except as mentioned in the HPI.   Physical Exam: Vitals:   05/19/20 0810  BP: (!) 152/80  Pulse: 80   Body mass index is 24.5 kg/m. Gen:      No acute distress Psych: normal mood and affect  Data Reviewed:  Reviewed labs, radiology imaging, old records and pertinent past GI work up   Assessment and Plan/Recommendations:  60 year old very pleasant female here for follow-up visit for epigastric abdominal pain EGD negative for gastroduodenal ulcer, dysplasia or intestinal metaplasia.  Reassured patient that there was no evidence of gastric malignancy or neoplasia. Given she continues to have persistent abdominal pain, will plan to proceed with CT abdomen pelvis with contrast for further evaluation  IBS and abdominal cramping: Use dicyclomine 10 mg before meals and at bedtime as needed  GERD with erosive esophagitis and hiatal hernia: Use Nexium 40 mg daily, 30 minutes before breakfast Antireflux measures  Return in 6 to 8 weeks or sooner if needed  This visit required 40 minutes of patient care (this includes precharting, chart review, review of results, face-to-face time used for counseling as well as treatment plan and follow-up. The patient was provided an opportunity to ask questions and all were answered. The patient agreed with the plan and demonstrated an understanding of the instructions.  [name] , MD    CC: Jill Shackleton, NP-C

## 2020-05-25 ENCOUNTER — Ambulatory Visit (HOSPITAL_COMMUNITY)
Admission: RE | Admit: 2020-05-25 | Discharge: 2020-05-25 | Disposition: A | Discharge status: Home / Self Care | Payer: 59 | Source: Ambulatory Visit | Attending: Gastroenterology | Admitting: Gastroenterology

## 2020-05-25 ENCOUNTER — Other Ambulatory Visit: Payer: Self-pay

## 2020-05-25 DIAGNOSIS — R1013 Epigastric pain: Secondary | ICD-10-CM | POA: Insufficient documentation

## 2020-05-25 DIAGNOSIS — K449 Diaphragmatic hernia without obstruction or gangrene: Secondary | ICD-10-CM | POA: Diagnosis not present

## 2020-05-25 DIAGNOSIS — K219 Gastro-esophageal reflux disease without esophagitis: Secondary | ICD-10-CM | POA: Diagnosis not present

## 2020-05-25 IMAGING — CT CT ABD-PELV W/ CM
2 of 5 series · 16 of 46 positions shown, 18 images · IV contrast (APPLIED)
Comparison: None.

CLINICAL DATA: Epigastric pain.  Abdominal discomfort.

EXAM:
CT ABDOMEN AND PELVIS WITH CONTRAST
TECHNIQUE: Multidetector CT imaging of the abdomen and pelvis was performed
using the standard protocol following bolus administration of
intravenous contrast.
CONTRAST:  100mL OMNIPAQUE IOHEXOL 300 MG/ML  SOLN

[Series 2: axial st · axial · 0.72mm/px · z∈[-377,-22]mm · 13 of 83 slices shown, 15 images]
[im 6/83  soft-tissue]
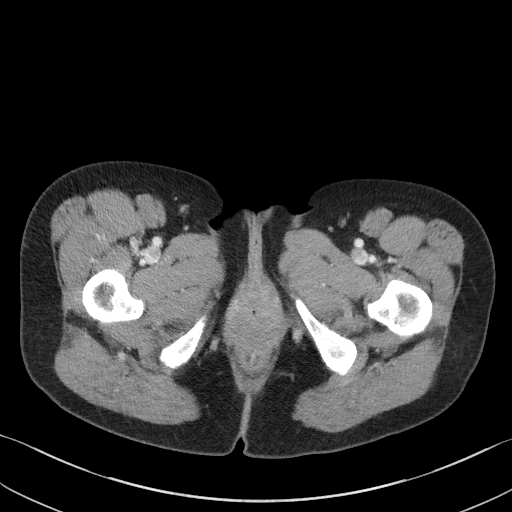
[im 6/83  bone]
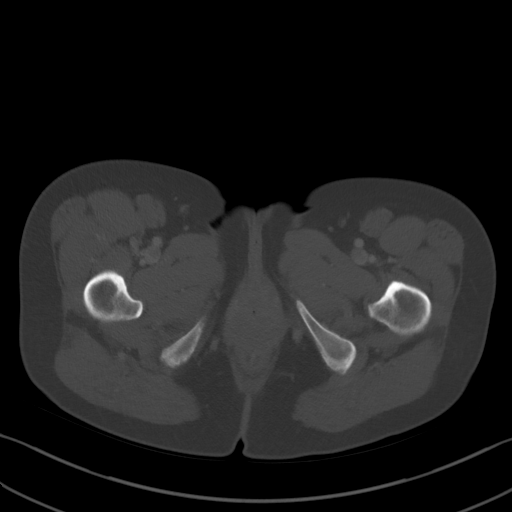
[im 11/83  soft-tissue]
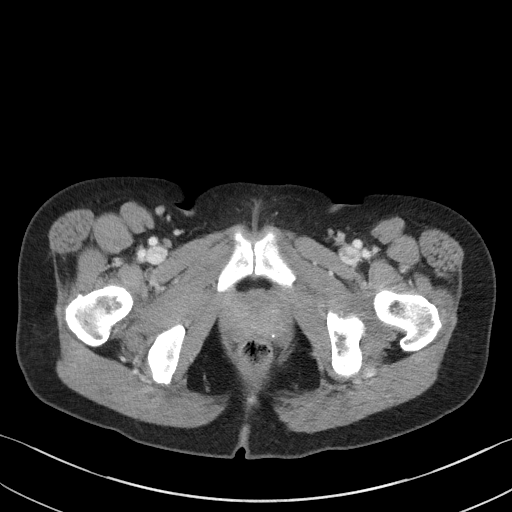
[im 17/83  soft-tissue]
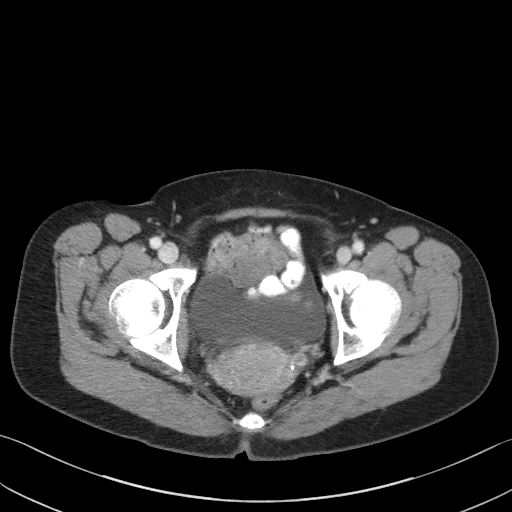
[im 22/83  soft-tissue]
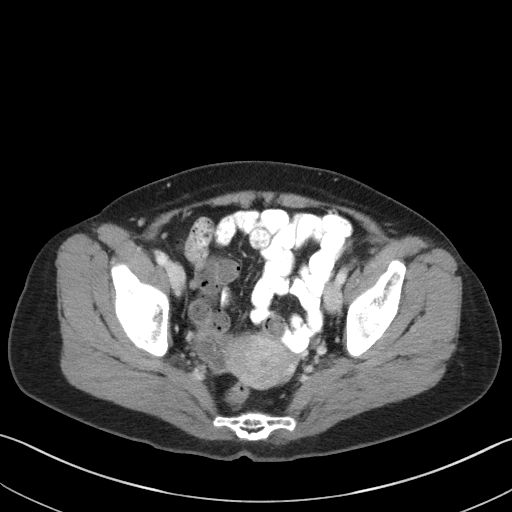
[im 28/83  soft-tissue]
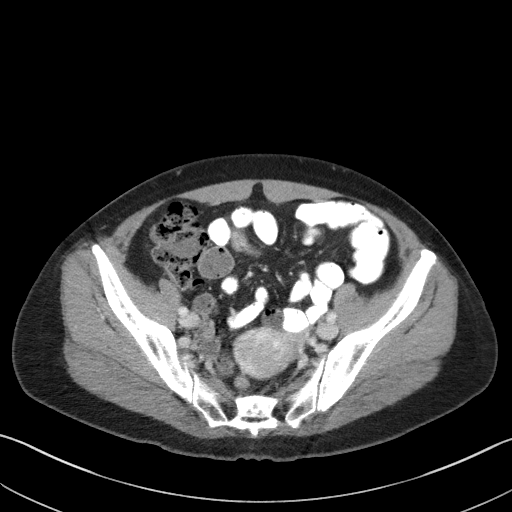
[im 33/83  soft-tissue]
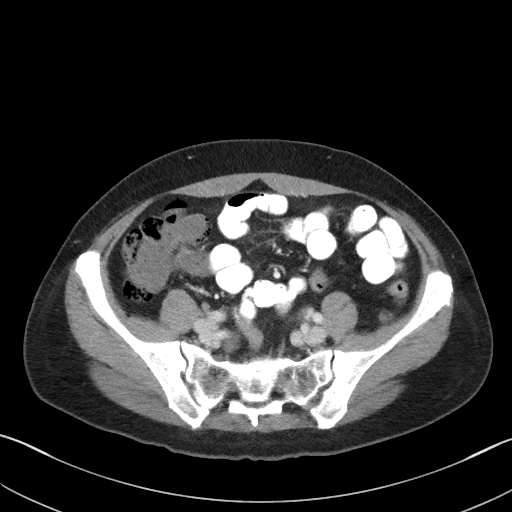
[im 44/83  soft-tissue]
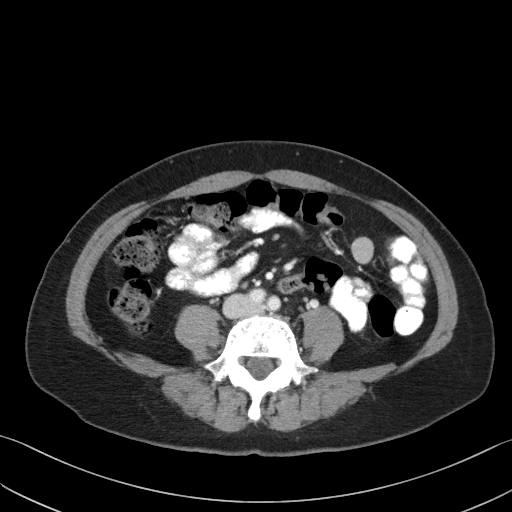
[im 50/83  soft-tissue]
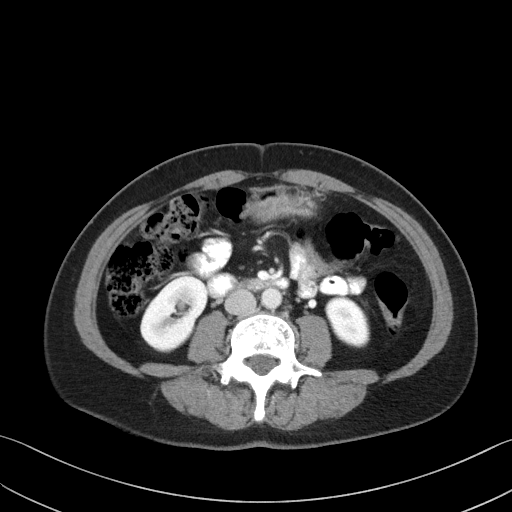
[im 55/83  soft-tissue]
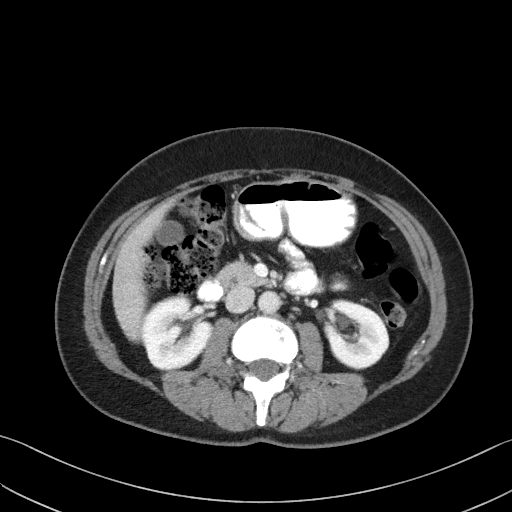
[im 55/83  bone]
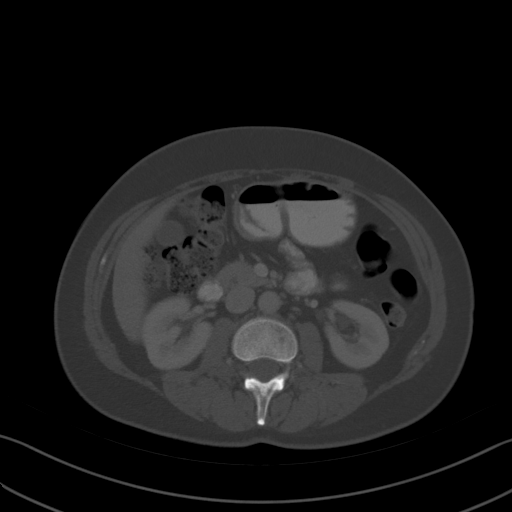
[im 61/83  soft-tissue]
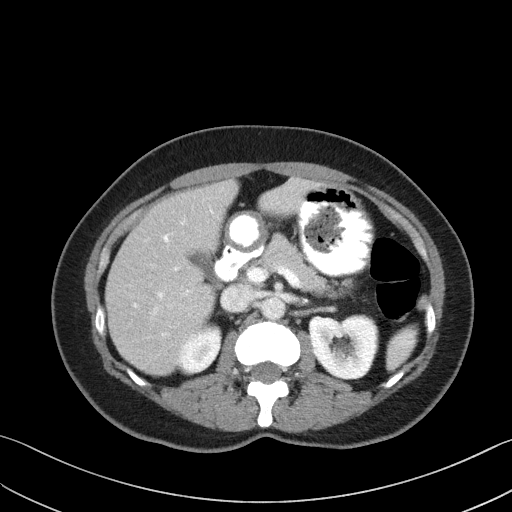
[im 66/83  soft-tissue]
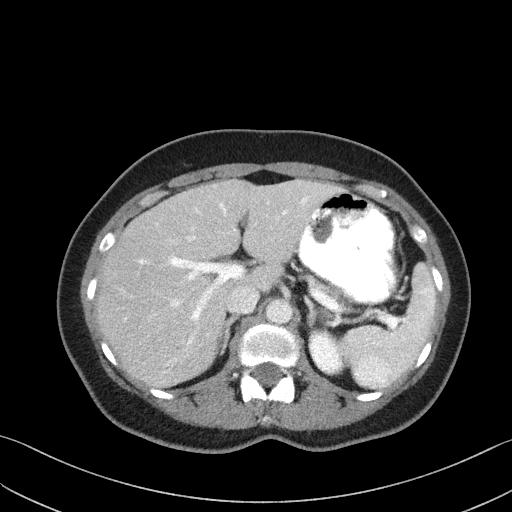
[im 72/83  soft-tissue]
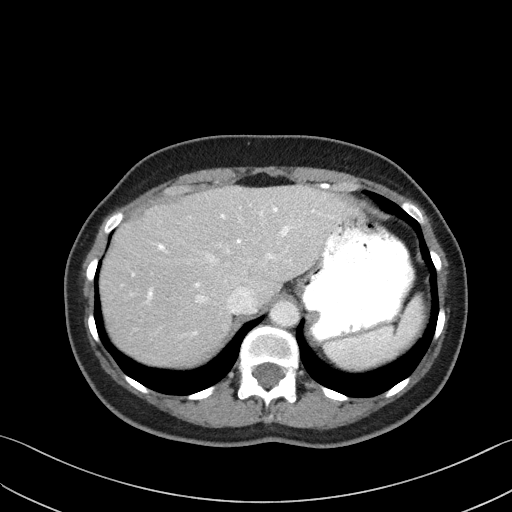
[im 77/83  soft-tissue]
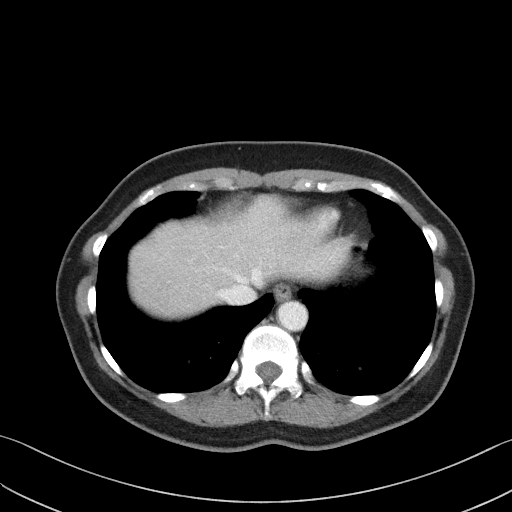

[Series 5: coronal st · coronal · 0.68mm/px · 3 of 82 slices shown]
[im 28/82  soft-tissue]
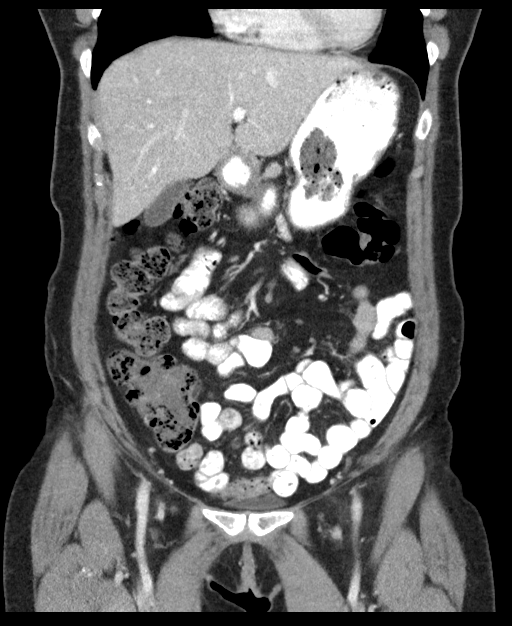
[im 37/82  soft-tissue]
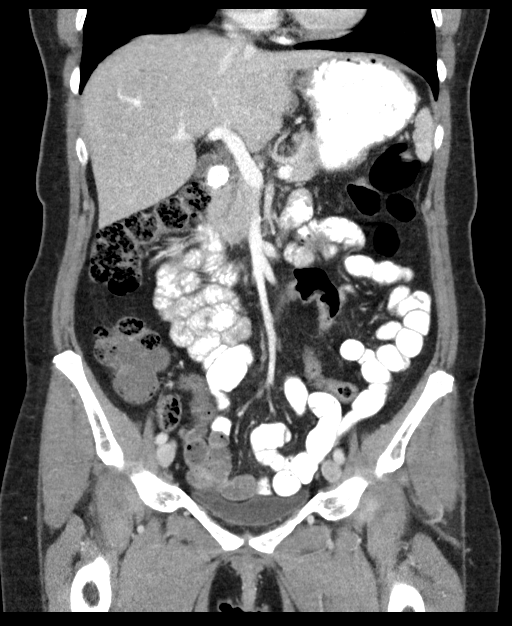
[im 46/82  soft-tissue]
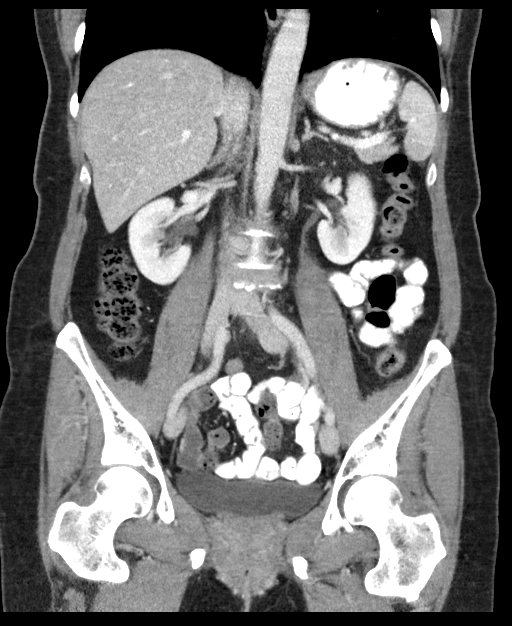

[16 of 46 positions shown; findings below may reference images not displayed]

FINDINGS: Lower chest: The lung bases are clear. The heart size is normal.

Hepatobiliary: The liver is normal. Normal gallbladder.There is no
biliary ductal dilation.

Pancreas: Normal contours without ductal dilatation. No
peripancreatic fluid collection.

Spleen: There is an indeterminate mass in the spleen measuring
approximately 2.9 cm.

Adrenals/Urinary Tract:

--Adrenal glands: Unremarkable.

--Right kidney/ureter: No hydronephrosis or radiopaque kidney
stones.

--Left kidney/ureter: No hydronephrosis or radiopaque kidney stones.

--Urinary bladder: Unremarkable.

Stomach/Bowel:

--Stomach/Duodenum: No hiatal hernia or other gastric abnormality.
Normal duodenal course and caliber.

--Small bowel: Unremarkable.

--Colon: Unremarkable.

--Appendix: Normal.

Vascular/Lymphatic: Normal course and caliber of the major abdominal
vessels.

--No retroperitoneal lymphadenopathy.

--No mesenteric lymphadenopathy.

--No pelvic or inguinal lymphadenopathy.

Reproductive: Unremarkable

Other: No ascites or free air. The abdominal wall is normal.

Musculoskeletal. No acute displaced fractures.
IMPRESSION: 1. No acute abdominopelvic abnormality.
2. Indeterminate mass in the spleen measuring up to 2.9 cm.
Follow-up with a nonemergent outpatient contrast enhanced MRI is
recommended for further evaluation.

## 2020-05-25 MED ORDER — IOHEXOL 300 MG/ML  SOLN
100.0000 mL | Freq: Once | INTRAMUSCULAR | Status: AC | PRN
  Administered 2020-05-25: 100 mL via INTRAVENOUS

## 2020-06-02 ENCOUNTER — Other Ambulatory Visit: Payer: Self-pay

## 2020-06-02 DIAGNOSIS — D7389 Other diseases of spleen: Secondary | ICD-10-CM

## 2020-06-03 ENCOUNTER — Ambulatory Visit (HOSPITAL_COMMUNITY)
Admission: RE | Admit: 2020-06-03 | Discharge: 2020-06-03 | Disposition: A | Discharge status: Home / Self Care | Payer: 59 | Source: Ambulatory Visit | Attending: Gastroenterology | Admitting: Gastroenterology

## 2020-06-03 DIAGNOSIS — D7389 Other diseases of spleen: Secondary | ICD-10-CM | POA: Diagnosis not present

## 2020-06-03 DIAGNOSIS — R161 Splenomegaly, not elsewhere classified: Secondary | ICD-10-CM | POA: Diagnosis not present

## 2020-06-03 IMAGING — MR MR ABDOMEN WO/W CM
18 series · 48 of 48 positions shown · IV contrast (gadavist)
Comparison: CT on [DATE]

CLINICAL DATA: Indeterminate splenic lesion on recent CT.

EXAM:
MRI ABDOMEN WITHOUT AND WITH CONTRAST
TECHNIQUE: Multiplanar multisequence MR imaging of the abdomen was performed
both before and after the administration of intravenous contrast.
CONTRAST:  6mL GADAVIST GADOBUTROL 1 MMOL/ML IV SOLN

[Series 3: T2 · coronal · 6.0mm · 1.17mm/px · 2 of 28 slices shown (1 of 2)]
[im 1/28]
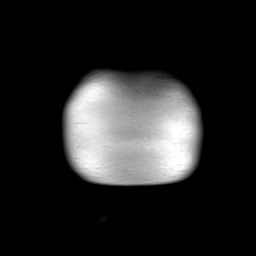
[im 28/28]
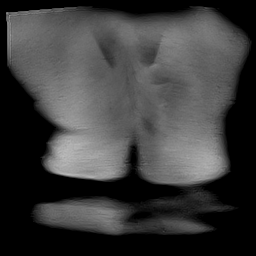

[Series 4: T2 fat-sat · axial · 6.0mm · 0.94mm/px · z∈[-142,+96]mm · 2 of 34 slices shown]
[im 1/34]
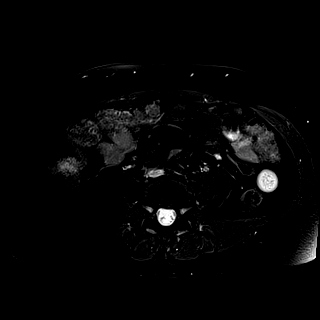
[im 34/34]
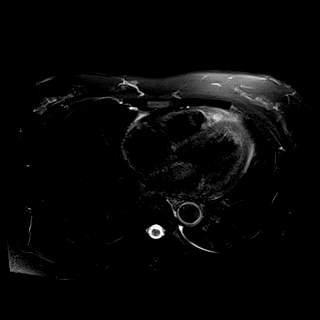

[Series 6: T1 · axial · 3.0mm · 0.94mm/px · z∈[-150,+87]mm · 4 of 80 slices shown (1 of 2)]
[im 1/80]
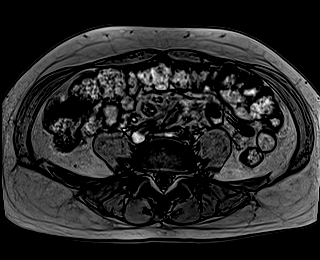
[im 27/80]
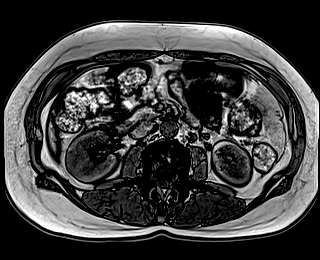
[im 53/80]
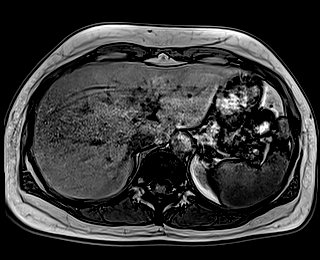
[im 80/80]
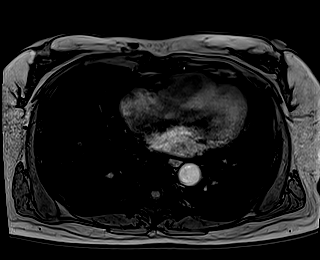

[Series 7: T1 · axial · 3.0mm · 0.94mm/px · z∈[-150,+87]mm · 3 of 80 slices shown (2 of 2)]
[im 1/80]
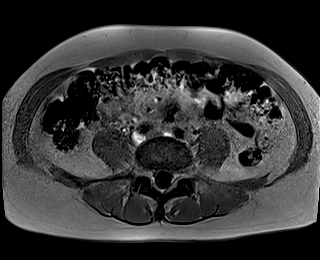
[im 40/80]
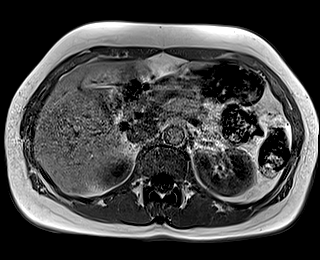
[im 80/80]
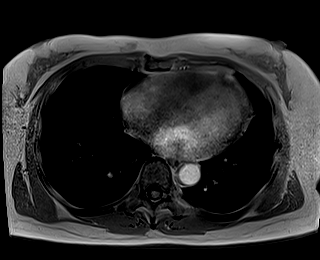

[Series 8: DWI · axial · 6.0mm · 1.36mm/px · z∈[-154,+97]mm · 3 of 72 slices shown (1 of 2)]
[im 1/72]
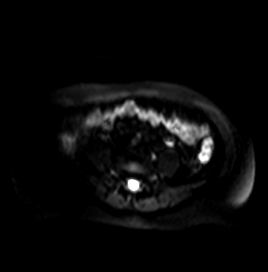
[im 36/72]
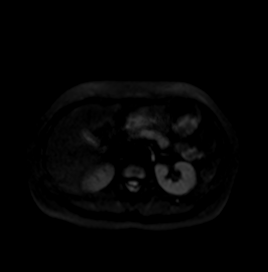
[im 72/72]
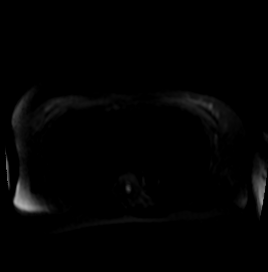

[Series 9: DWI · axial · 6.0mm · 1.36mm/px · 1 of 36 slices shown (2 of 2)]
[im 1/36]
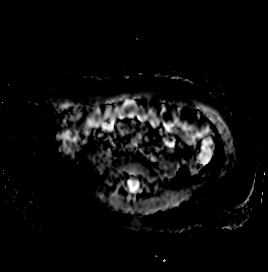

[Series 10: bSSFP · axial · 4.0mm · 0.59mm/px · z∈[-157,+91]mm · 2 of 63 slices shown]
[im 1/63]
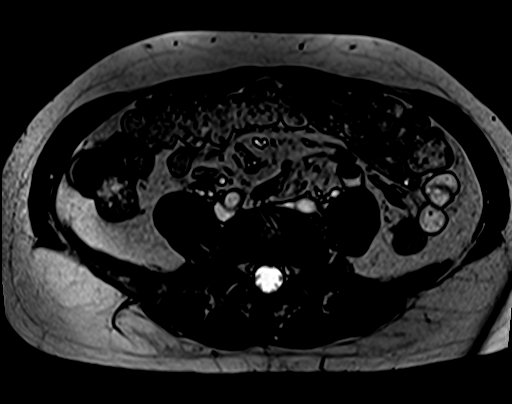
[im 63/63]
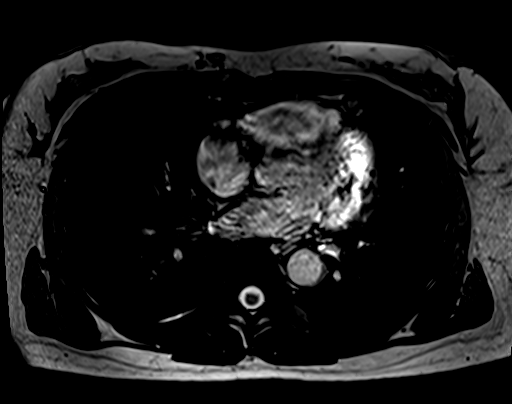

[Series 12: T1 dynamic · axial · 3.0mm · 0.94mm/px · z∈[-156,+105]mm · 3 of 88 slices shown (1 of 10)]
[im 1/88]
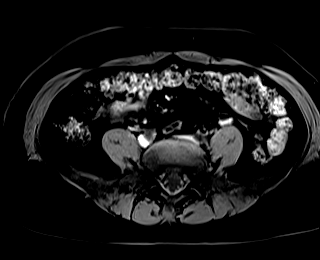
[im 44/88]
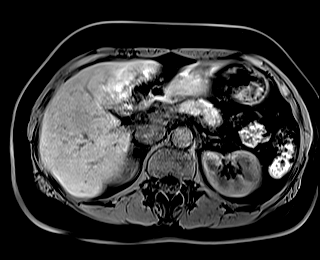
[im 88/88]
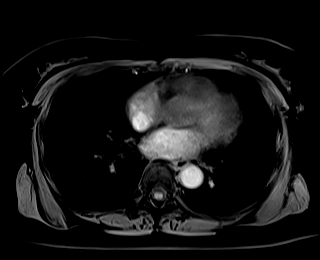

[Series 16: T1 dynamic · axial · 3.0mm · 0.94mm/px · z∈[-156,+105]mm · 3 of 88 slices shown (2 of 10)]
[im 1/88]
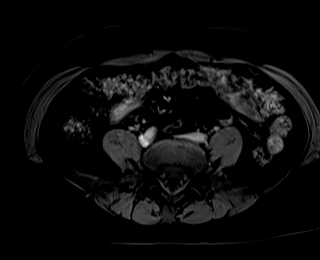
[im 44/88]
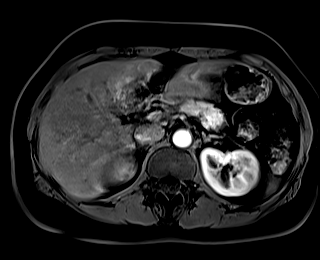
[im 88/88]
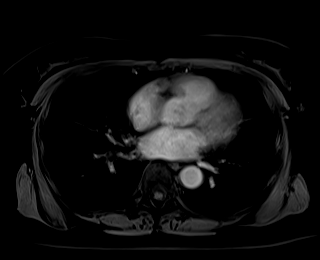

[Series 17: T1 dynamic · axial · 3.0mm · 0.94mm/px · z∈[-156,+105]mm · 3 of 88 slices shown (3 of 10)]
[im 1/88]
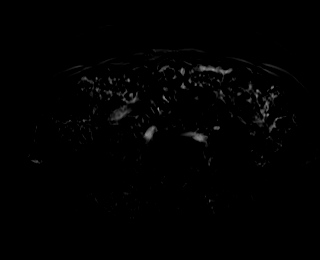
[im 44/88]
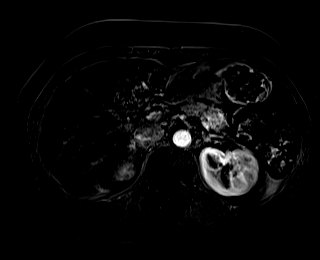
[im 88/88]
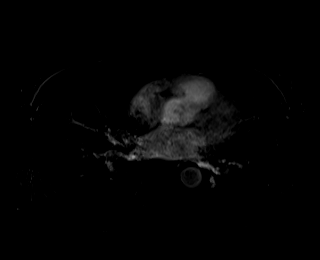

[Series 20: T1 dynamic · axial · 3.0mm · 0.94mm/px · z∈[-156,+105]mm · 3 of 88 slices shown (4 of 10)]
[im 1/88]
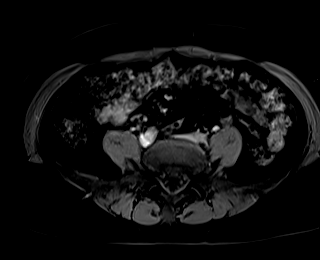
[im 44/88]
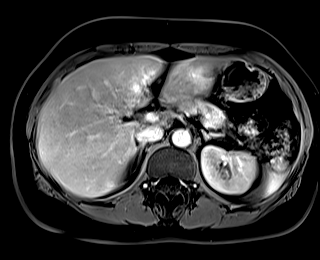
[im 88/88]
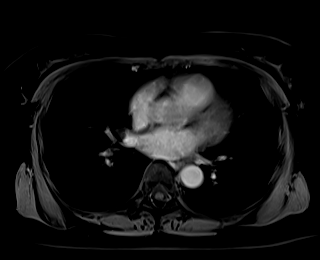

[Series 21: T1 dynamic · axial · 3.0mm · 0.94mm/px · z∈[-156,+105]mm · 3 of 88 slices shown (5 of 10)]
[im 1/88]
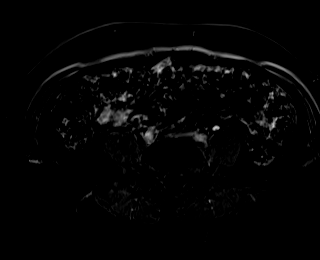
[im 44/88]
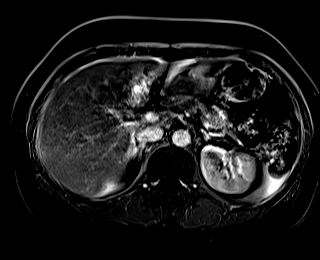
[im 88/88]
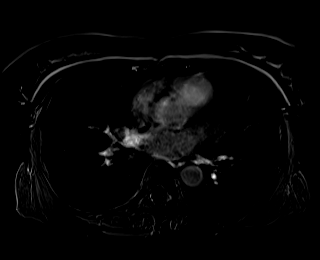

[Series 24: T1 dynamic · axial · 3.0mm · 0.94mm/px · z∈[-156,+105]mm · 3 of 88 slices shown (6 of 10)]
[im 1/88]
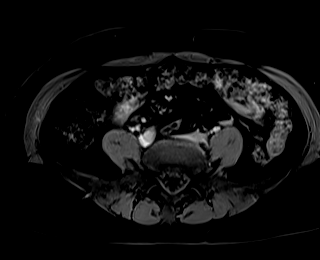
[im 44/88]
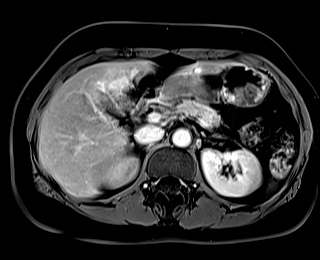
[im 88/88]
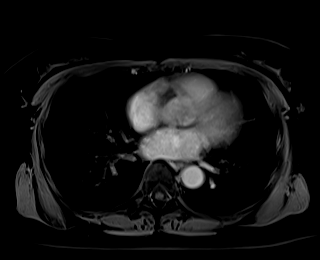

[Series 25: T1 dynamic · axial · 3.0mm · 0.94mm/px · z∈[-156,+105]mm · 3 of 88 slices shown (7 of 10)]
[im 1/88]
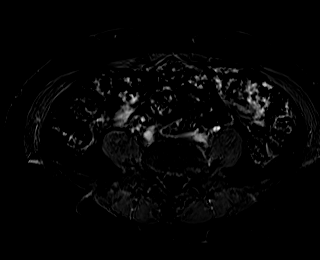
[im 44/88]
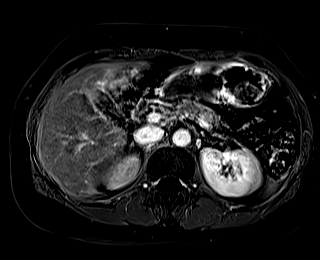
[im 88/88]
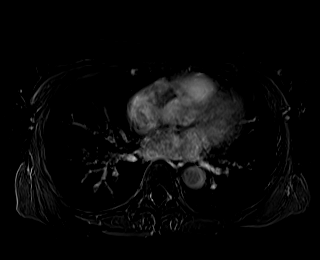

[Series 27: T1 dynamic · coronal · 3.0mm · 0.94mm/px · 3 of 72 slices shown (8 of 10)]
[im 1/72]
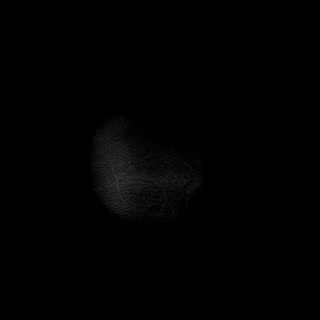
[im 36/72]
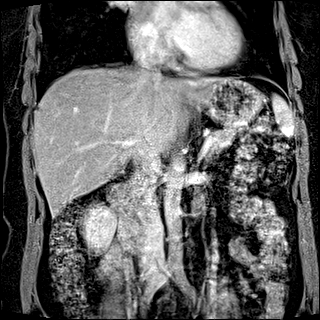
[im 72/72]
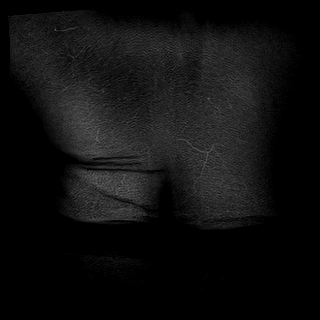

[Series 28: T2 · axial · 6.0mm · 1.17mm/px · 1 of 35 slices shown (2 of 2)]
[im 1/35]
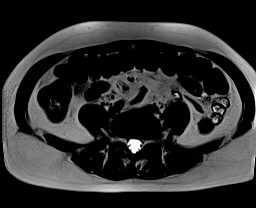

[Series 31: T1 dynamic · axial · 3.0mm · 0.94mm/px · z∈[-156,+105]mm · 3 of 88 slices shown (9 of 10)]
[im 1/88]
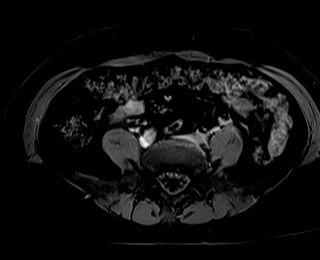
[im 44/88]
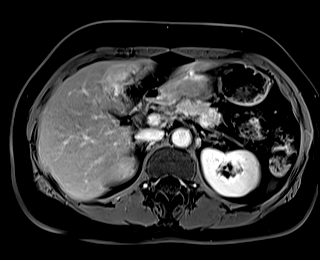
[im 88/88]
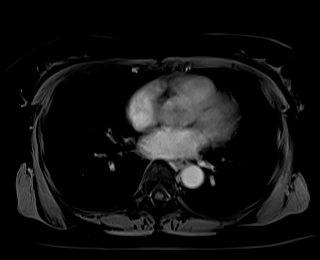

[Series 32: T1 dynamic · axial · 3.0mm · 0.94mm/px · z∈[-156,+105]mm · 3 of 88 slices shown (10 of 10)]
[im 1/88]
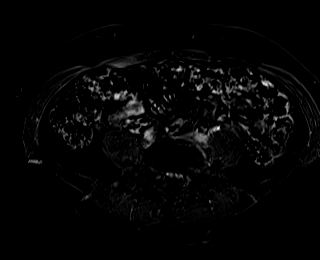
[im 44/88]
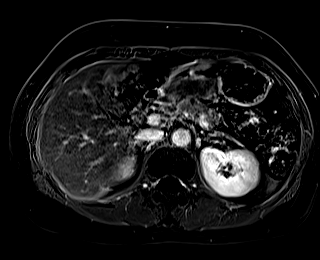
[im 88/88]
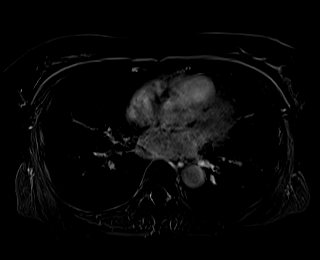

[48 of 48 positions shown; findings below may reference images not displayed]

FINDINGS: Lower chest: No acute findings.

Hepatobiliary: No hepatic masses identified. Gallbladder is
unremarkable. No evidence of biliary ductal dilatation.

Pancreas:  No mass or inflammatory changes.

Spleen: No evidence of splenomegaly. A 2.9 cm mass is seen in the
posterior aspect of the spleen which shows moderate homogeneous T2
hypointensity, and homogeneous hyperenhancement without washout.
This is likely benign in etiology, with possibilities including
splenic hemangioma and hamartoma. No other splenic masses
identified.

Adrenals/Urinary Tract: No masses identified. No evidence of
hydronephrosis.

Stomach/Bowel: Visualized portion unremarkable.

Vascular/Lymphatic: No pathologically enlarged lymph nodes
identified. No abdominal aortic aneurysm.

Other:  None.

Musculoskeletal:  No suspicious bone lesions identified.
IMPRESSION: 2.9 cm splenic mass, most likely representing a benign lesion such
as a hemangioma and hamartoma. Recommend continued follow-up by MRI
in 6 months. This recommendation follows ACR consensus guidelines:
White Paper of the ACR Incidental Findings Committee II on Splenic
and Nodal Findings. [HOSPITAL] [J8];[DATE].

## 2020-06-03 MED ORDER — GADOBUTROL 1 MMOL/ML IV SOLN
6.0000 mL | Freq: Once | INTRAVENOUS | Status: AC | PRN
  Administered 2020-06-03: 6 mL via INTRAVENOUS

## 2020-07-11 ENCOUNTER — Other Ambulatory Visit (HOSPITAL_COMMUNITY): Payer: Self-pay

## 2020-07-11 ENCOUNTER — Other Ambulatory Visit: Payer: Self-pay | Admitting: Nurse Practitioner

## 2020-07-11 ENCOUNTER — Encounter: Payer: Self-pay | Admitting: Nurse Practitioner

## 2020-07-11 DIAGNOSIS — Z79890 Hormone replacement therapy (postmenopausal): Secondary | ICD-10-CM

## 2020-07-11 MED ORDER — ESTRADIOL 0.025 MG/24HR TD PTWK
0.0250 mg | MEDICATED_PATCH | TRANSDERMAL | 1 refills | Status: DC
  Filled 2020-07-11: qty 12, 84d supply, fill #0
  Filled 2020-09-19: qty 12, 84d supply, fill #1

## 2020-07-12 ENCOUNTER — Other Ambulatory Visit (HOSPITAL_COMMUNITY): Payer: Self-pay

## 2020-07-12 ENCOUNTER — Ambulatory Visit: Payer: 59 | Admitting: Gastroenterology

## 2020-07-12 MED FILL — Progesterone Cap 100 MG: ORAL | 90 days supply | Qty: 90 | Fill #0 | Status: AC

## 2020-07-13 ENCOUNTER — Other Ambulatory Visit (HOSPITAL_COMMUNITY): Payer: Self-pay

## 2020-08-09 ENCOUNTER — Other Ambulatory Visit: Payer: Self-pay | Admitting: Family Medicine

## 2020-08-09 ENCOUNTER — Other Ambulatory Visit (HOSPITAL_COMMUNITY): Payer: Self-pay

## 2020-08-09 MED ORDER — ALBUTEROL SULFATE HFA 108 (90 BASE) MCG/ACT IN AERS
2.0000 | INHALATION_SPRAY | Freq: Four times a day (QID) | RESPIRATORY_TRACT | 0 refills | Status: DC | PRN
  Filled 2020-08-09: qty 18, 25d supply, fill #0

## 2020-08-09 NOTE — Telephone Encounter (Signed)
Pt has an appt in July for cpe

## 2020-08-17 ENCOUNTER — Other Ambulatory Visit: Payer: 59

## 2020-08-22 ENCOUNTER — Other Ambulatory Visit (INDEPENDENT_AMBULATORY_CARE_PROVIDER_SITE_OTHER): Payer: 59

## 2020-08-22 DIAGNOSIS — Z23 Encounter for immunization: Secondary | ICD-10-CM

## 2020-09-19 ENCOUNTER — Other Ambulatory Visit (HOSPITAL_COMMUNITY): Payer: Self-pay

## 2020-09-19 MED FILL — Progesterone Cap 100 MG: ORAL | 90 days supply | Qty: 90 | Fill #1 | Status: AC

## 2020-09-22 ENCOUNTER — Other Ambulatory Visit (HOSPITAL_COMMUNITY): Payer: Self-pay

## 2020-09-30 ENCOUNTER — Other Ambulatory Visit (HOSPITAL_COMMUNITY): Payer: Self-pay

## 2020-10-24 ENCOUNTER — Other Ambulatory Visit: Payer: Self-pay

## 2020-10-25 ENCOUNTER — Encounter: Payer: 59 | Admitting: Family Medicine

## 2020-10-27 ENCOUNTER — Other Ambulatory Visit: Payer: Self-pay

## 2020-10-27 DIAGNOSIS — D7389 Other diseases of spleen: Secondary | ICD-10-CM

## 2020-11-02 ENCOUNTER — Other Ambulatory Visit: Payer: Self-pay

## 2020-11-02 ENCOUNTER — Other Ambulatory Visit: Payer: Self-pay | Admitting: Gastroenterology

## 2020-11-02 DIAGNOSIS — Z1231 Encounter for screening mammogram for malignant neoplasm of breast: Secondary | ICD-10-CM

## 2020-11-15 ENCOUNTER — Other Ambulatory Visit: Payer: 59

## 2020-12-07 ENCOUNTER — Other Ambulatory Visit: Payer: Self-pay

## 2020-12-07 ENCOUNTER — Ambulatory Visit (HOSPITAL_COMMUNITY)
Admission: RE | Admit: 2020-12-07 | Discharge: 2020-12-07 | Disposition: A | Discharge status: Home / Self Care | Payer: 59 | Source: Ambulatory Visit | Attending: Gastroenterology | Admitting: Gastroenterology

## 2020-12-07 DIAGNOSIS — D7389 Other diseases of spleen: Secondary | ICD-10-CM | POA: Diagnosis not present

## 2020-12-07 IMAGING — MR MR ABDOMEN WO/W CM
18 series · 48 of 48 positions shown · IV contrast (6ml GADAVIST)
Comparison: [DATE]

CLINICAL DATA: Splenic mass

EXAM:
MRI ABDOMEN WITHOUT AND WITH CONTRAST
TECHNIQUE: Multiplanar multisequence MR imaging of the abdomen was performed
both before and after the administration of intravenous contrast.
CONTRAST:  6mL GADAVIST GADOBUTROL 1 MMOL/ML IV SOLN

[Series 3: T2 · coronal · 6.0mm · 1.56mm/px · 2 of 30 slices shown (1 of 2)]
[im 1/30]
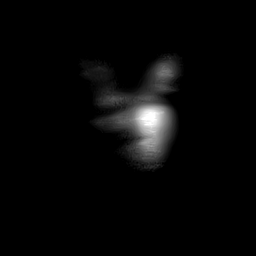
[im 30/30]
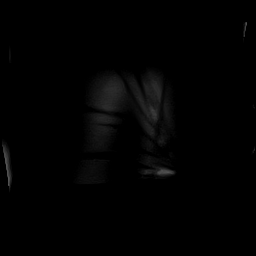

[Series 4: T2 fat-sat · axial · 6.0mm · 1.25mm/px · z∈[-142,+110]mm · 2 of 36 slices shown]
[im 1/36]
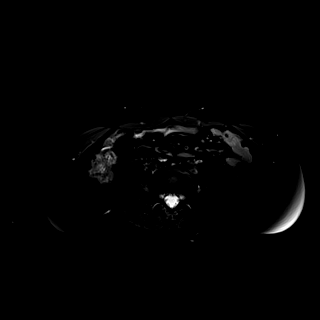
[im 36/36]
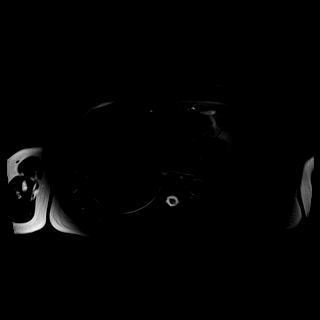

[Series 6: T1 · axial · 3.0mm · 1.25mm/px · z∈[-143,+94]mm · 4 of 80 slices shown (1 of 2)]
[im 1/80]
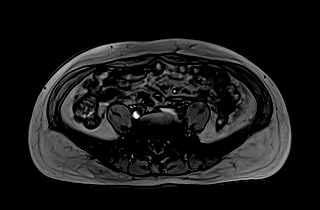
[im 27/80]
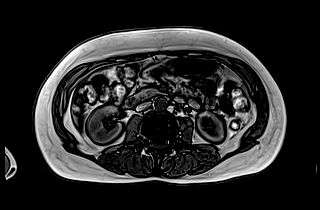
[im 53/80]
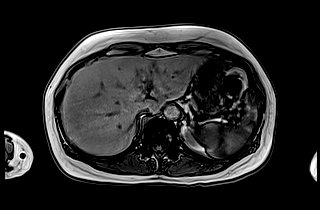
[im 80/80]
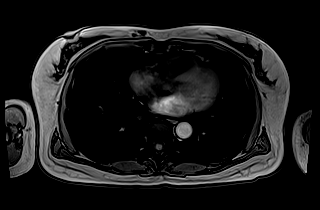

[Series 7: T1 · axial · 3.0mm · 1.25mm/px · z∈[-143,+94]mm · 3 of 80 slices shown (2 of 2)]
[im 1/80]
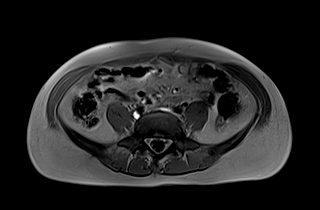
[im 40/80]
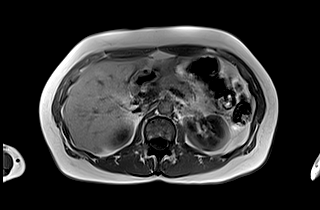
[im 80/80]
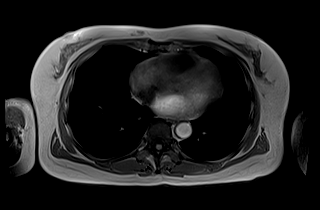

[Series 8: DWI · axial · 6.0mm · 1.49mm/px · z∈[-133,+133]mm · 3 of 76 slices shown (1 of 2)]
[im 1/76]
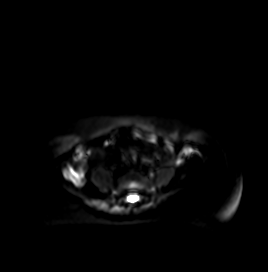
[im 38/76]
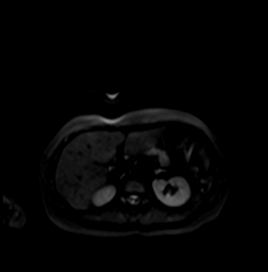
[im 76/76]
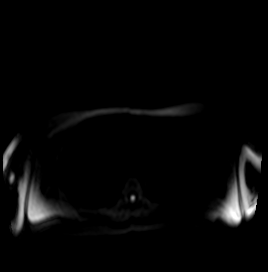

[Series 9: DWI · axial · 6.0mm · 1.49mm/px · 1 of 38 slices shown (2 of 2)]
[im 1/38]
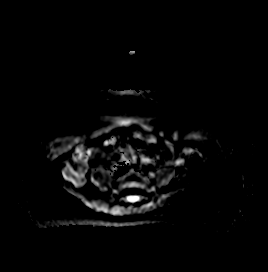

[Series 10: bSSFP · axial · 4.0mm · 0.78mm/px · z∈[-124,+112]mm · 2 of 60 slices shown]
[im 1/60]
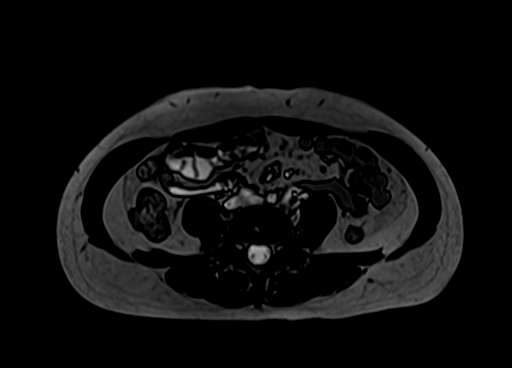
[im 60/60]
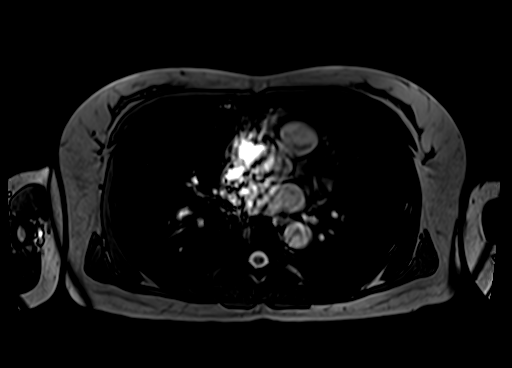

[Series 12: T1 dynamic · axial · 3.0mm · 1.25mm/px · z∈[-140,+97]mm · 3 of 80 slices shown (1 of 10)]
[im 1/80]
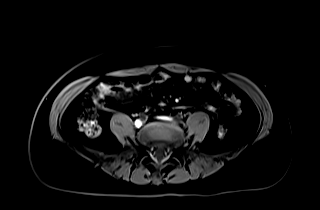
[im 40/80]
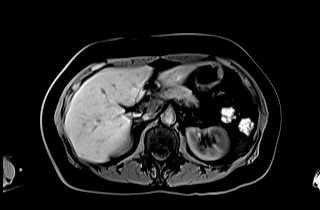
[im 80/80]
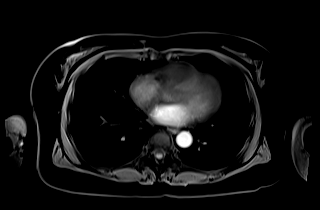

[Series 16: T1 dynamic · axial · 3.0mm · 1.25mm/px · z∈[-140,+97]mm · 3 of 80 slices shown (2 of 10)]
[im 1/80]
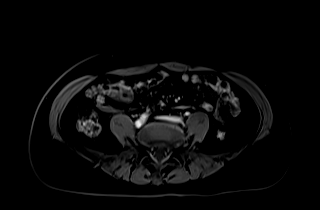
[im 40/80]
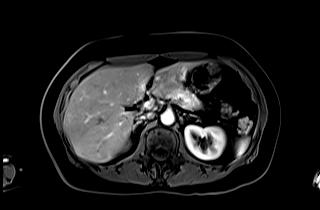
[im 80/80]
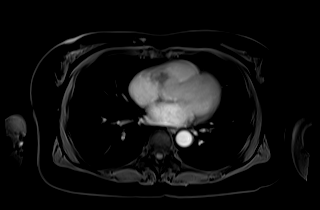

[Series 17: T1 dynamic · axial · 3.0mm · 1.25mm/px · z∈[-140,+97]mm · 3 of 80 slices shown (3 of 10)]
[im 1/80]
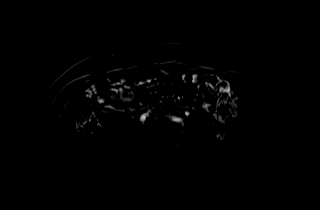
[im 40/80]
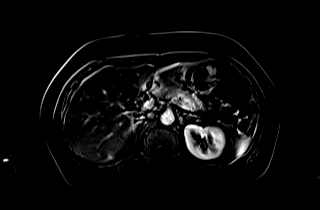
[im 80/80]
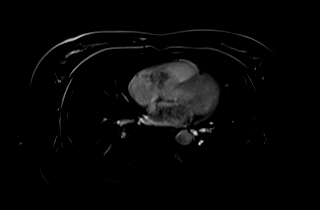

[Series 20: T1 dynamic · axial · 3.0mm · 1.25mm/px · z∈[-140,+97]mm · 3 of 80 slices shown (4 of 10)]
[im 1/80]
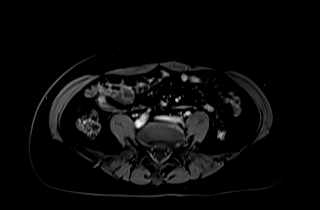
[im 40/80]
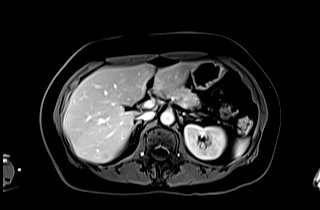
[im 80/80]
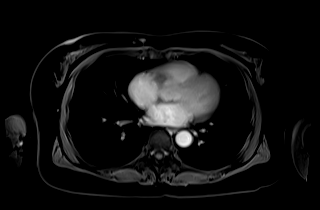

[Series 21: T1 dynamic · axial · 3.0mm · 1.25mm/px · z∈[-140,+97]mm · 3 of 80 slices shown (5 of 10)]
[im 1/80]
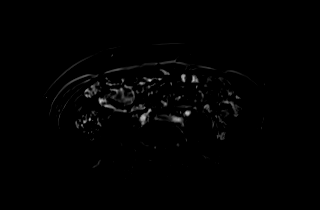
[im 40/80]
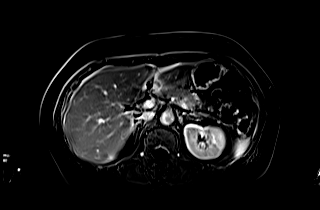
[im 80/80]
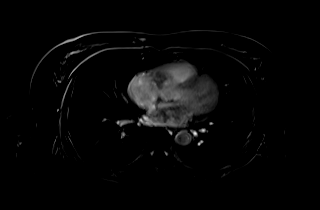

[Series 24: T1 dynamic · axial · 3.0mm · 1.25mm/px · z∈[-140,+97]mm · 3 of 80 slices shown (6 of 10)]
[im 1/80]
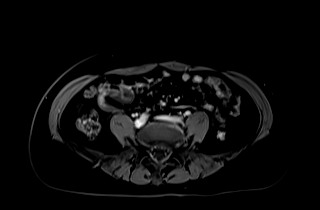
[im 40/80]
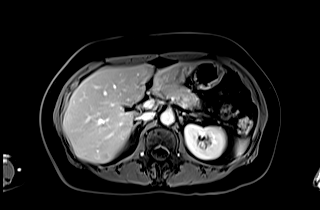
[im 80/80]
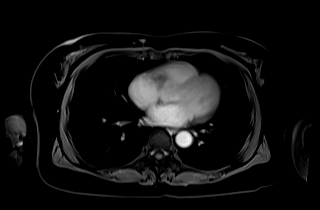

[Series 25: T1 dynamic · axial · 3.0mm · 1.25mm/px · z∈[-140,+97]mm · 3 of 80 slices shown (7 of 10)]
[im 1/80]
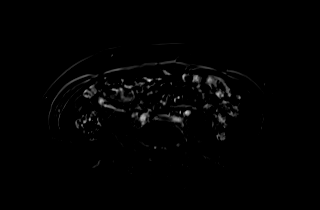
[im 40/80]
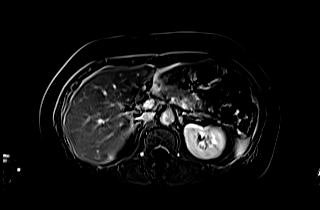
[im 80/80]
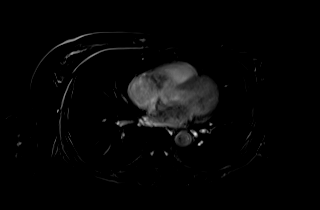

[Series 27: T1 dynamic · coronal · 4.0mm · 1.41mm/px · 2 of 60 slices shown (8 of 10)]
[im 1/60]
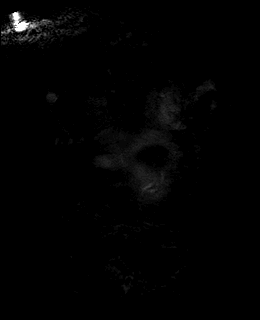
[im 60/60]
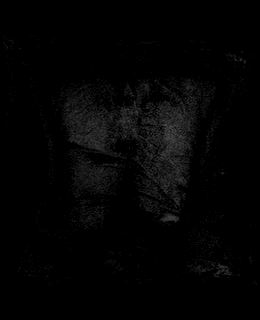

[Series 28: T2 · axial · 6.0mm · 1.56mm/px · z∈[-161,+113]mm · 2 of 39 slices shown (2 of 2)]
[im 1/39]
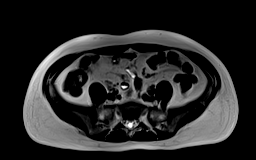
[im 39/39]
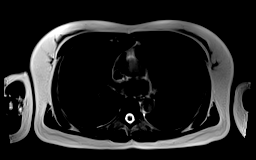

[Series 31: T1 dynamic · axial · 3.0mm · 1.25mm/px · z∈[-140,+97]mm · 3 of 80 slices shown (9 of 10)]
[im 1/80]
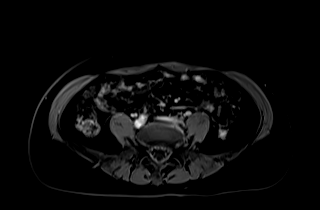
[im 40/80]
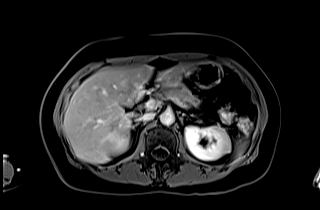
[im 80/80]
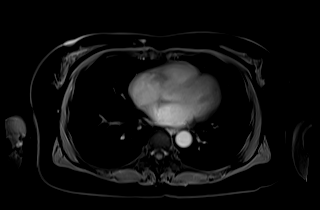

[Series 32: T1 dynamic · axial · 3.0mm · 1.25mm/px · z∈[-140,+97]mm · 3 of 80 slices shown (10 of 10)]
[im 1/80]
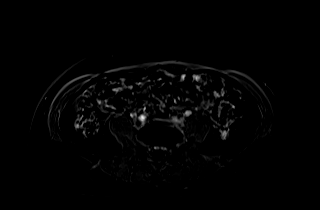
[im 40/80]
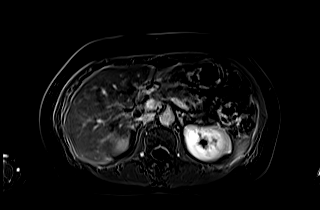
[im 80/80]
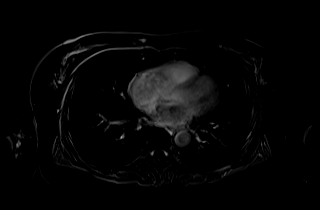

[48 of 48 positions shown; findings below may reference images not displayed]

FINDINGS: Lower chest: Unremarkable.

Hepatobiliary: No suspicious focal abnormality within the liver
parenchyma. There is no evidence for gallstones, gallbladder wall
thickening, or pericholecystic fluid. No intrahepatic or
extrahepatic biliary dilation.

Pancreas: No focal mass lesion. No dilatation of the main duct. No
intraparenchymal cyst. No peripancreatic edema.

Spleen: Posterior, well-defined homogeneous lesion identified in the
spleen. This is nearly isointense to splenic parenchyma on T1
imaging, shows mild T2 hyperintensity and enhances diffusely after
IV contrast administration. Lesion is stable in size measuring 2.8 x
2.5 cm today compared to 2.9 cm previously.

Adrenals/Urinary Tract: No adrenal nodule or mass. Kidneys
unremarkable.

Stomach/Bowel: No small bowel or colonic dilatation within the
visualized abdomen.

Vascular/Lymphatic: No abdominal aortic aneurysm. No abdominal
lymphadenopathy.

Other:  No free fluid.

Musculoskeletal: No focal suspicious marrow enhancement within the
visualized bony anatomy.
IMPRESSION: Stable 2.8 x 2.5 cm homogeneous, well-defined enhancing lesion in
the spleen. While imaging features are nonspecific, this likely
represents a benign etiology such as hemangioma or hamartoma.
Six-month interval stability is reassuring. One additional follow-up
MRI in 6 months is recommended to document 12 months of stability.
This recommendation follows ACR consensus guidelines: White Paper of
the ACR Incidental Findings Committee II on Splenic and Nodal
Findings. [HOSPITAL] [M9];[DATE].

## 2020-12-07 MED ORDER — GADOBUTROL 1 MMOL/ML IV SOLN
6.0000 mL | Freq: Once | INTRAVENOUS | Status: AC | PRN
  Administered 2020-12-07: 6 mL via INTRAVENOUS

## 2020-12-14 ENCOUNTER — Ambulatory Visit: Payer: 59 | Admitting: Gastroenterology

## 2020-12-15 ENCOUNTER — Other Ambulatory Visit: Payer: Self-pay | Admitting: Nurse Practitioner

## 2020-12-15 DIAGNOSIS — Z79890 Hormone replacement therapy (postmenopausal): Secondary | ICD-10-CM

## 2020-12-16 ENCOUNTER — Encounter: Payer: Self-pay | Admitting: Nurse Practitioner

## 2020-12-16 ENCOUNTER — Other Ambulatory Visit: Payer: Self-pay | Admitting: Nurse Practitioner

## 2020-12-16 ENCOUNTER — Other Ambulatory Visit (HOSPITAL_COMMUNITY): Payer: Self-pay

## 2020-12-16 DIAGNOSIS — Z79890 Hormone replacement therapy (postmenopausal): Secondary | ICD-10-CM

## 2020-12-16 NOTE — Telephone Encounter (Signed)
Annual exam scheduled on 02/08/21

## 2020-12-16 NOTE — Telephone Encounter (Signed)
Annual exam scheduled on 02/08/21 

## 2020-12-20 ENCOUNTER — Other Ambulatory Visit: Payer: Self-pay | Admitting: Nurse Practitioner

## 2020-12-20 ENCOUNTER — Other Ambulatory Visit (HOSPITAL_COMMUNITY): Payer: Self-pay

## 2020-12-20 MED ORDER — PROGESTERONE MICRONIZED 100 MG PO CAPS
100.0000 mg | ORAL_CAPSULE | Freq: Every day | ORAL | 0 refills | Status: DC
  Filled 2020-12-20: qty 90, 90d supply, fill #0

## 2020-12-20 MED ORDER — ESTRADIOL 0.025 MG/24HR TD PTWK
0.0250 mg | MEDICATED_PATCH | TRANSDERMAL | 0 refills | Status: DC
  Filled 2020-12-20: qty 12, 84d supply, fill #0

## 2020-12-22 ENCOUNTER — Other Ambulatory Visit (HOSPITAL_COMMUNITY): Payer: Self-pay

## 2020-12-26 ENCOUNTER — Other Ambulatory Visit (HOSPITAL_COMMUNITY): Payer: Self-pay

## 2020-12-28 ENCOUNTER — Other Ambulatory Visit: Payer: Self-pay

## 2020-12-28 ENCOUNTER — Ambulatory Visit
Admission: RE | Admit: 2020-12-28 | Discharge: 2020-12-28 | Disposition: A | Discharge status: Home / Self Care | Payer: 59 | Source: Ambulatory Visit | Attending: Gastroenterology | Admitting: Gastroenterology

## 2020-12-28 DIAGNOSIS — Z1231 Encounter for screening mammogram for malignant neoplasm of breast: Secondary | ICD-10-CM

## 2020-12-28 IMAGING — MG MM DIGITAL SCREENING BILAT W/ TOMO AND CAD
8 series · 9 of 24 positions shown · non-contrast
Comparison: Previous exam(s).

CLINICAL DATA: Screening.

EXAM:
DIGITAL SCREENING BILATERAL MAMMOGRAM WITH TOMOSYNTHESIS AND CAD
TECHNIQUE: Bilateral screening digital craniocaudal and mediolateral oblique
mammograms were obtained. Bilateral screening digital breast
tomosynthesis was performed. The images were evaluated with
computer-aided detection.

[R CC synth-2D]
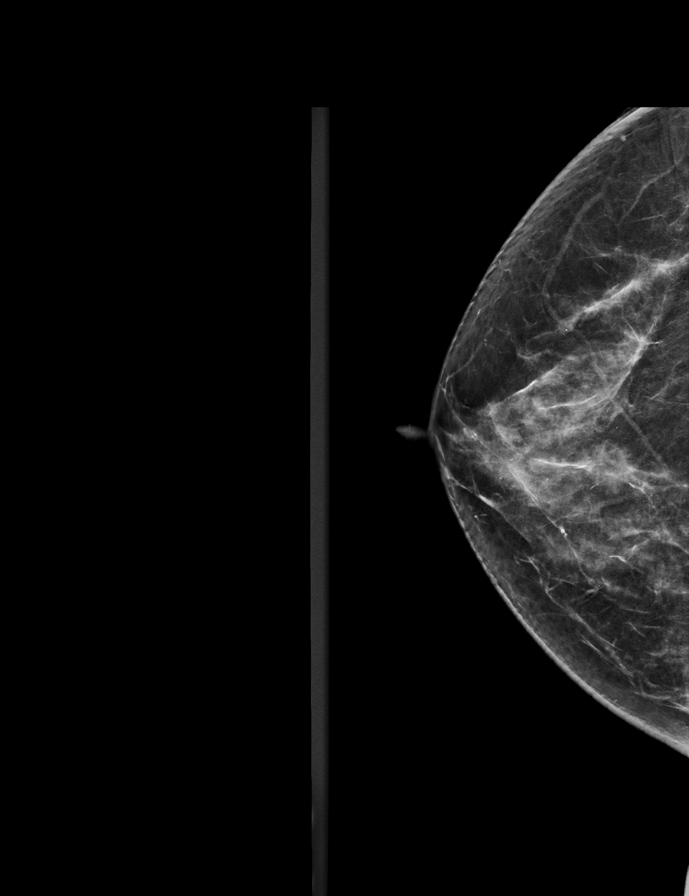

[L MLO synth-2D]
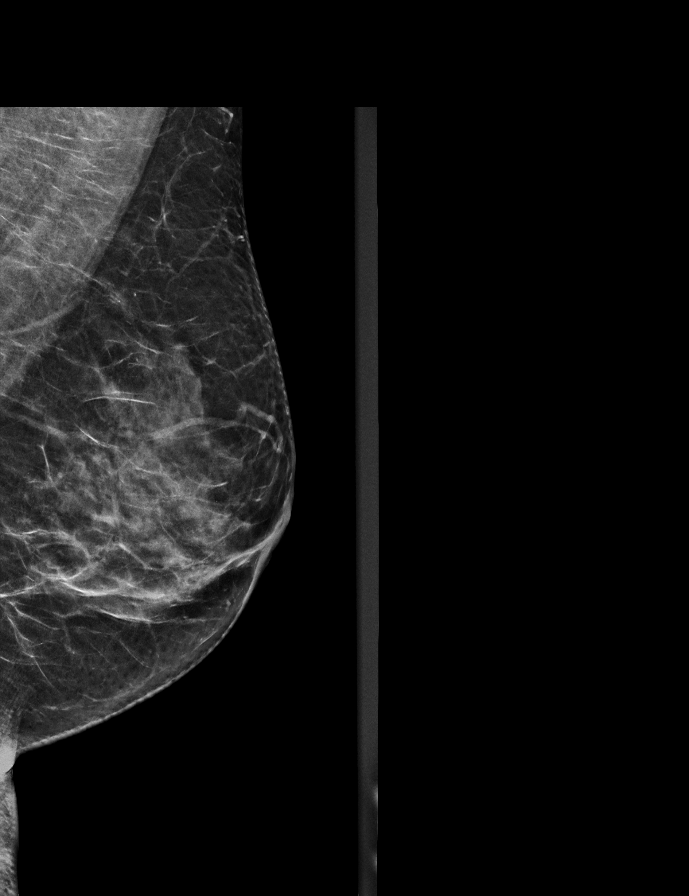

[R MLO synth-2D]
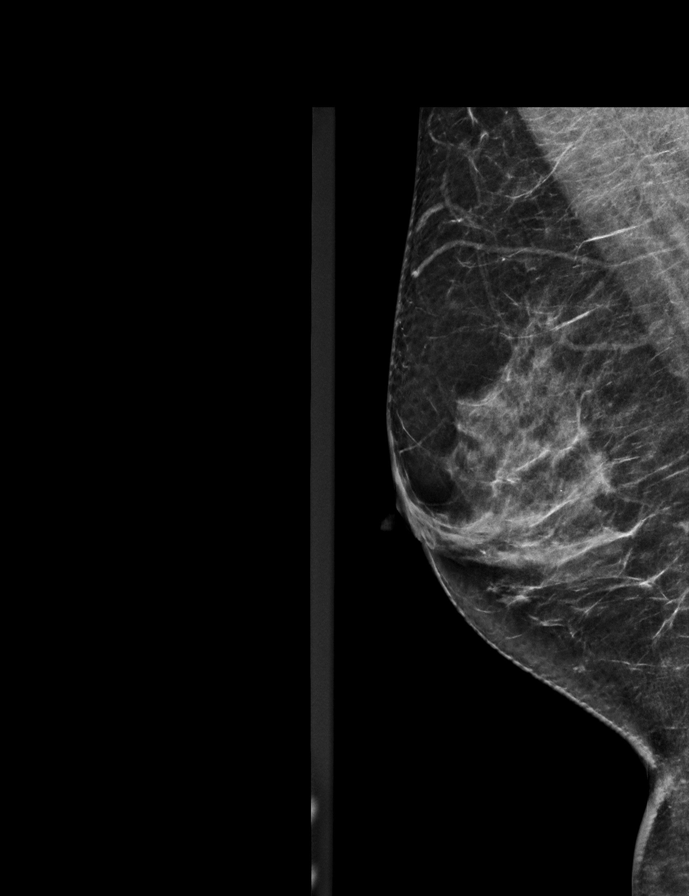

[L CC synth-2D]
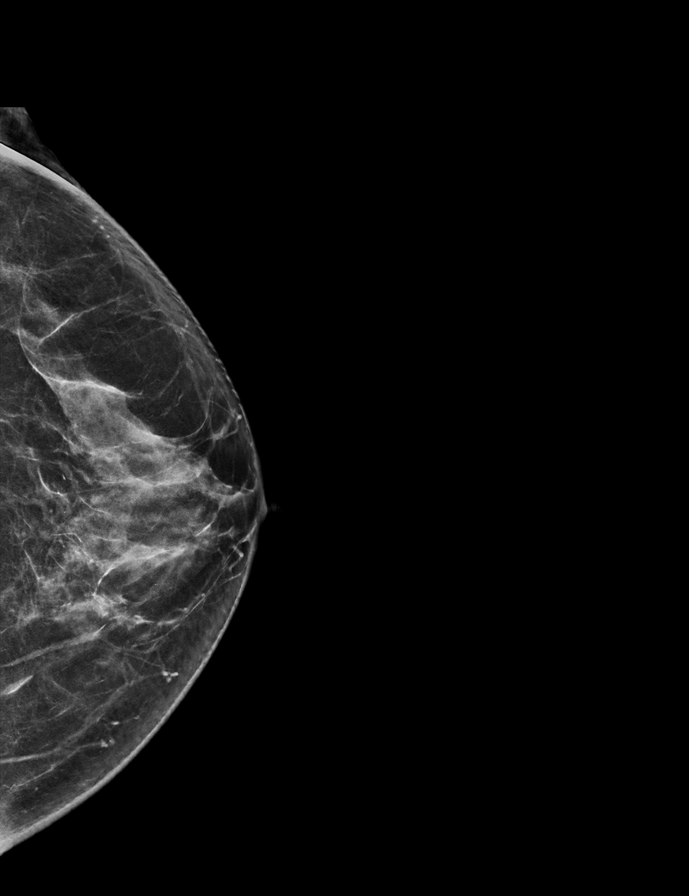

[R MLO tomo · 2 of 59 frames shown]
[frame 20/59]
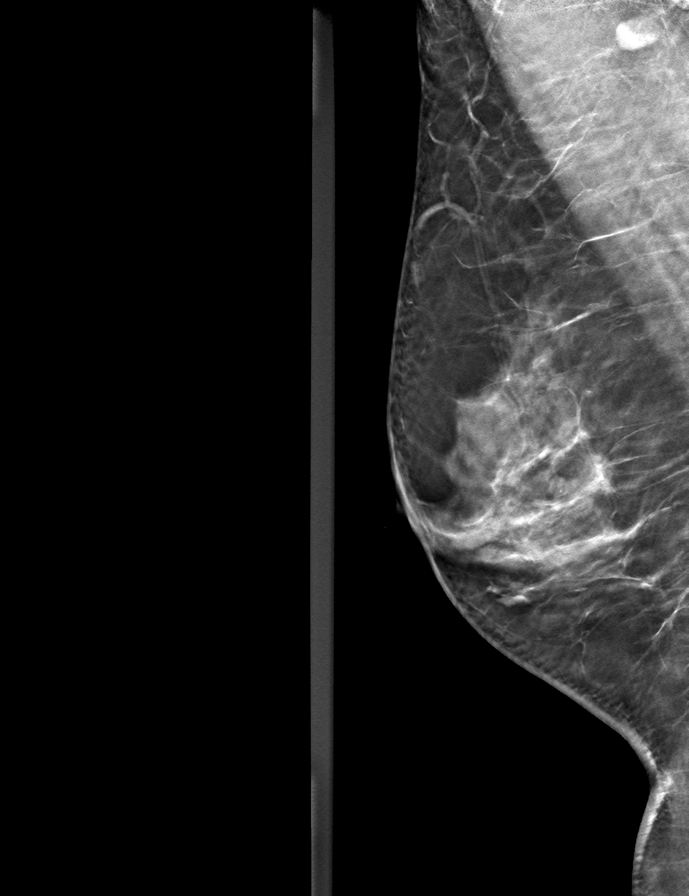
[frame 30/59]
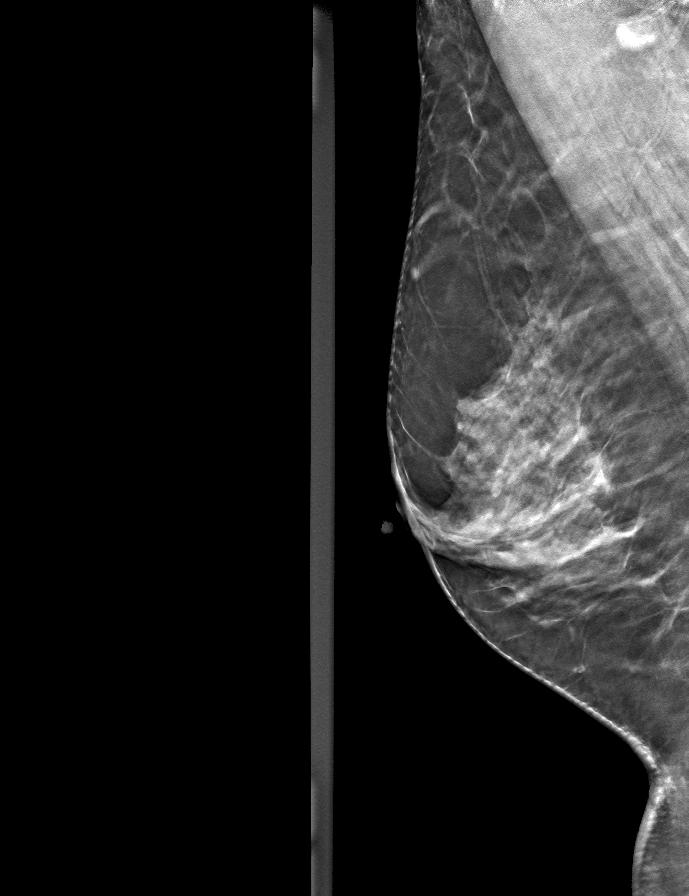

[L CC tomo · tomo slice 31/61.0]
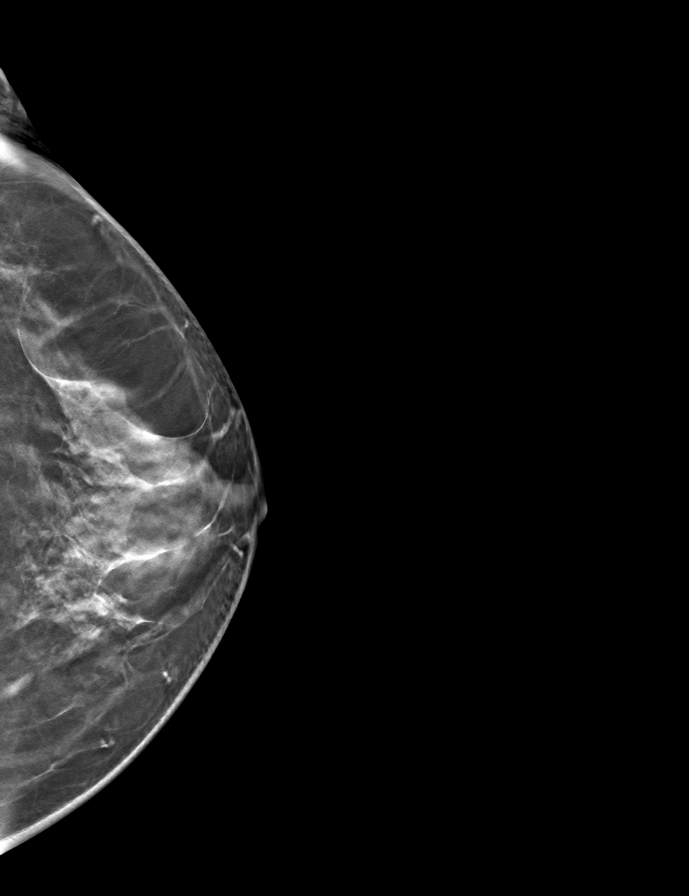

[R CC tomo · tomo slice 31/61.0]
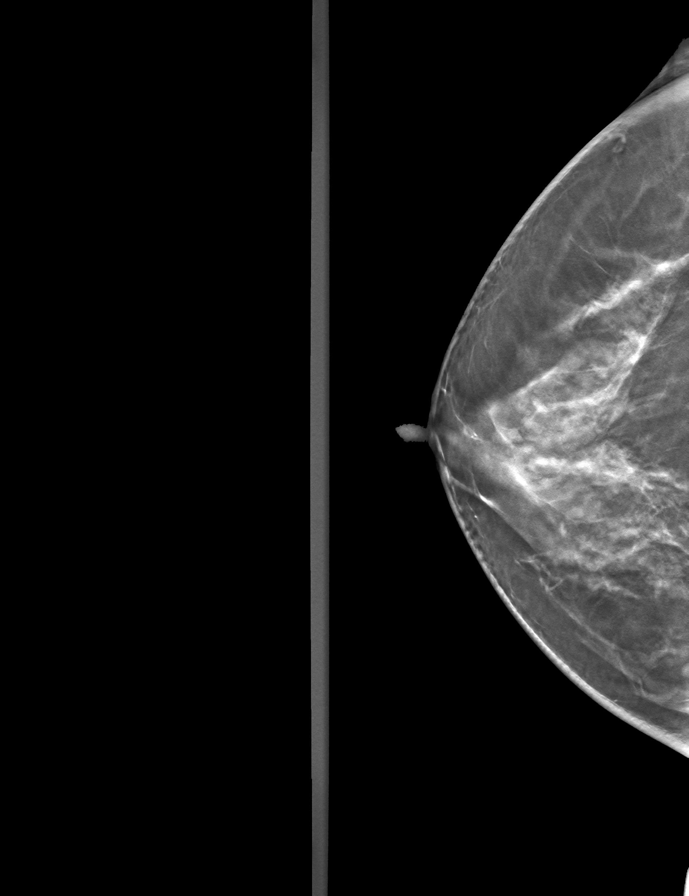

[L MLO tomo · tomo slice 29/57.0]
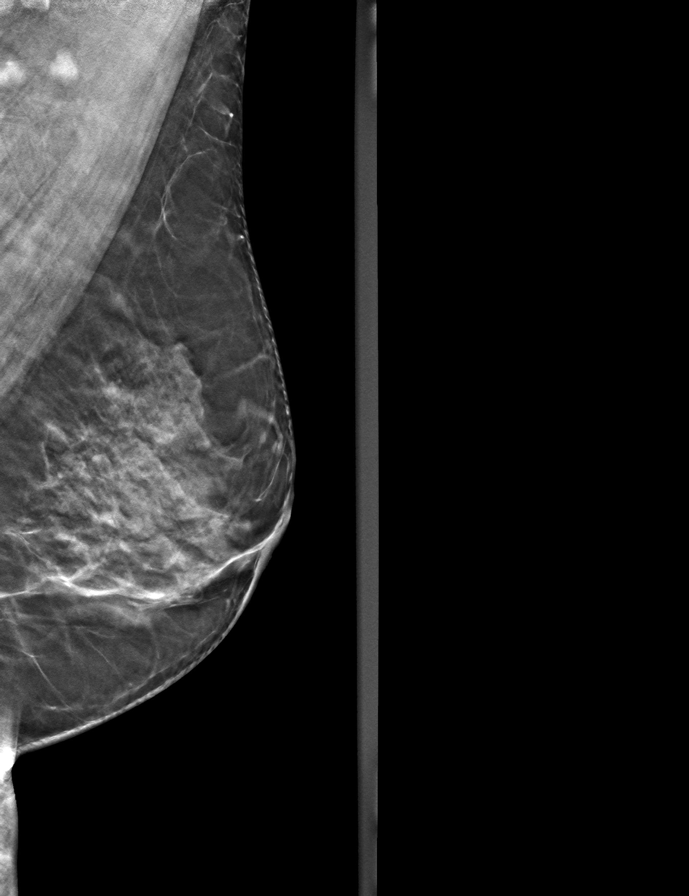

[9 of 24 positions shown; findings below may reference images not displayed]

ACR Breast Density Category c: The breast tissue is heterogeneously
dense, which may obscure small masses.
FINDINGS: In the right breast, a possible asymmetry warrants further
evaluation. In the left breast, no findings suspicious for
malignancy.
IMPRESSION: Further evaluation is suggested for possible asymmetry in the right
breast.

RECOMMENDATION:
Diagnostic mammogram and possibly ultrasound of the right breast.
(Code:[AN])

The patient will be contacted regarding the findings, and additional
imaging will be scheduled.

BI-RADS CATEGORY  0: Incomplete. Need additional imaging evaluation
and/or prior mammograms for comparison.

## 2021-01-03 ENCOUNTER — Other Ambulatory Visit: Payer: Self-pay

## 2021-01-03 ENCOUNTER — Other Ambulatory Visit (INDEPENDENT_AMBULATORY_CARE_PROVIDER_SITE_OTHER): Payer: 59

## 2021-01-03 DIAGNOSIS — Z23 Encounter for immunization: Secondary | ICD-10-CM

## 2021-01-04 ENCOUNTER — Other Ambulatory Visit: Payer: Self-pay | Admitting: Gastroenterology

## 2021-01-04 ENCOUNTER — Other Ambulatory Visit: Payer: Self-pay | Admitting: Family Medicine

## 2021-01-04 DIAGNOSIS — R928 Other abnormal and inconclusive findings on diagnostic imaging of breast: Secondary | ICD-10-CM

## 2021-01-18 ENCOUNTER — Ambulatory Visit
Admission: RE | Admit: 2021-01-18 | Discharge: 2021-01-18 | Disposition: A | Discharge status: Home / Self Care | Payer: 59 | Source: Ambulatory Visit | Attending: Family Medicine | Admitting: Family Medicine

## 2021-01-18 ENCOUNTER — Ambulatory Visit: Payer: 59

## 2021-01-18 ENCOUNTER — Other Ambulatory Visit: Payer: Self-pay

## 2021-01-18 DIAGNOSIS — R922 Inconclusive mammogram: Secondary | ICD-10-CM | POA: Diagnosis not present

## 2021-01-18 DIAGNOSIS — R928 Other abnormal and inconclusive findings on diagnostic imaging of breast: Secondary | ICD-10-CM

## 2021-01-18 IMAGING — MG MM DIGITAL DIAGNOSTIC UNILAT*R* W/ TOMO W/ CAD
4 series · 4 of 12 positions shown · non-contrast
Comparison: Previous exam(s).

CLINICAL DATA: Recall from screening mammography, possible focal
asymmetry involving the outer RIGHT breast at middle to posterior
depth.

EXAM:
DIGITAL DIAGNOSTIC UNILATERAL RIGHT MAMMOGRAM WITH TOMOSYNTHESIS
TECHNIQUE: Right digital diagnostic mammography and breast tomosynthesis was
performed. The images were evaluated with computer-aided detection.

[R CC synth-2D]
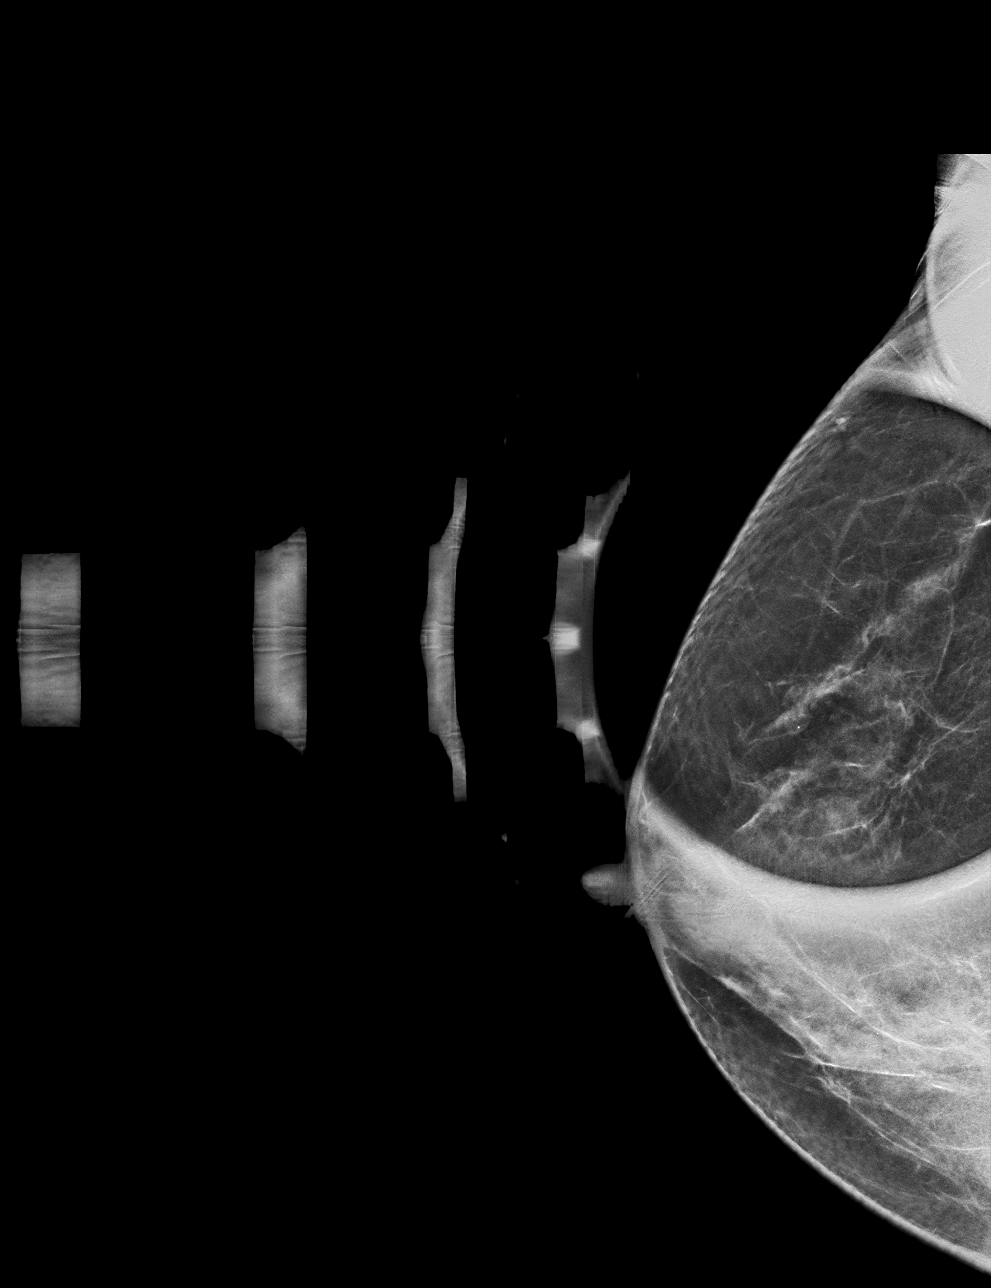

[R MLO synth-2D]
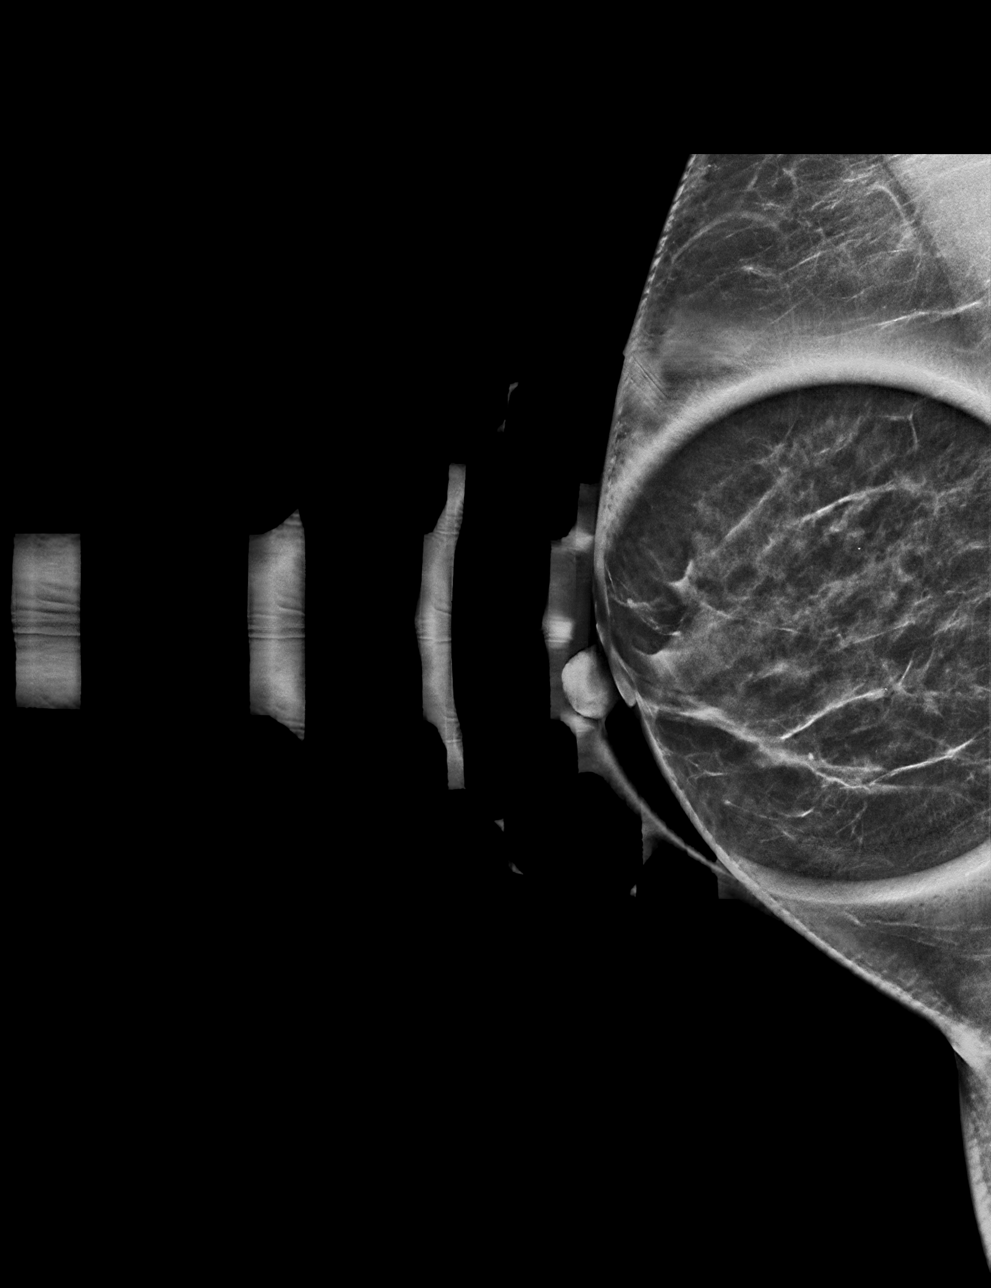

[R CC tomo · tomo slice 26/51.0]
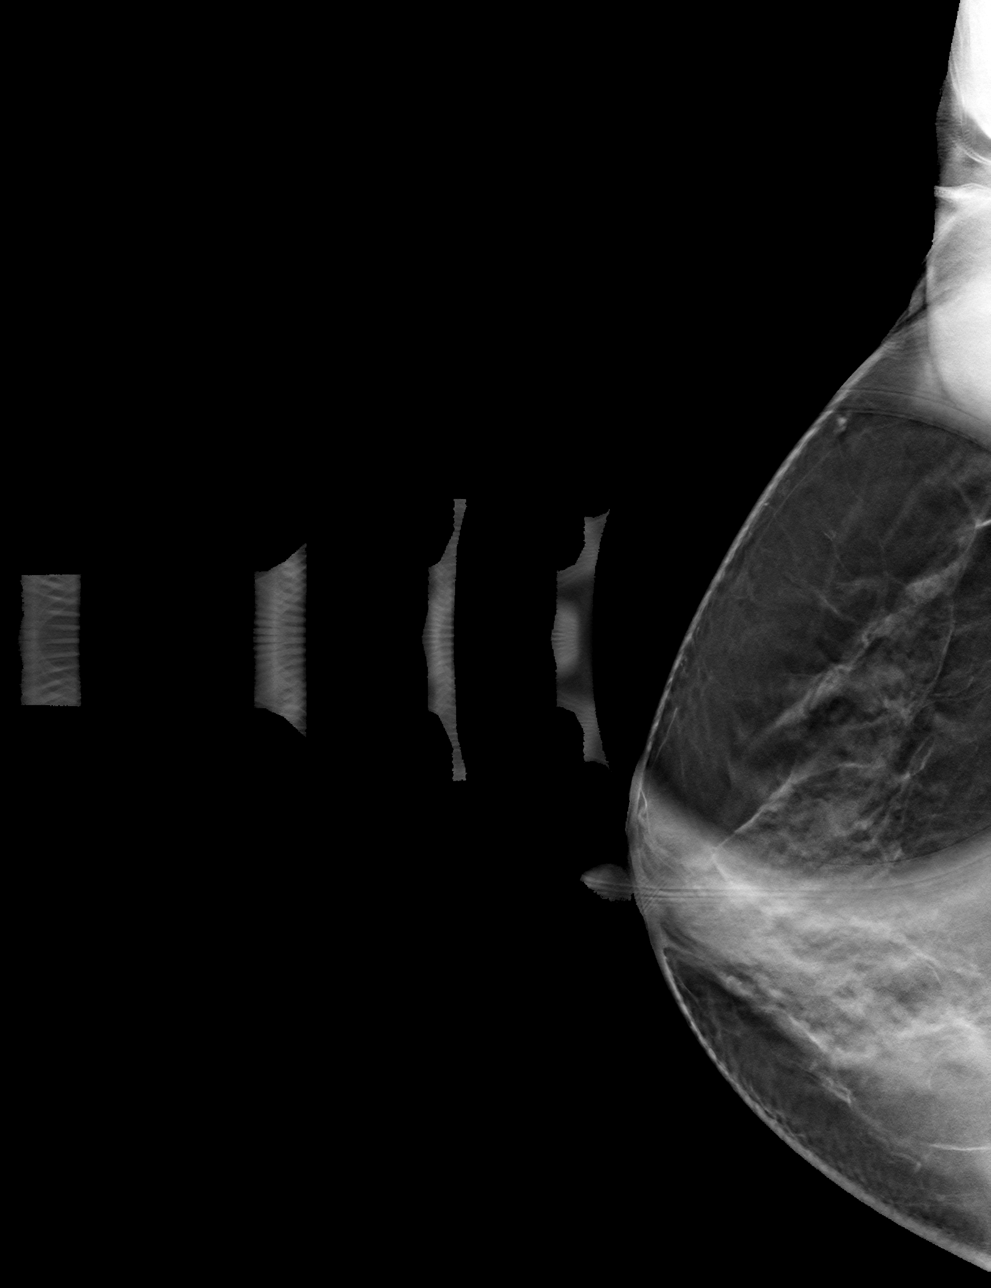

[R MLO tomo · tomo slice 25/50.0]
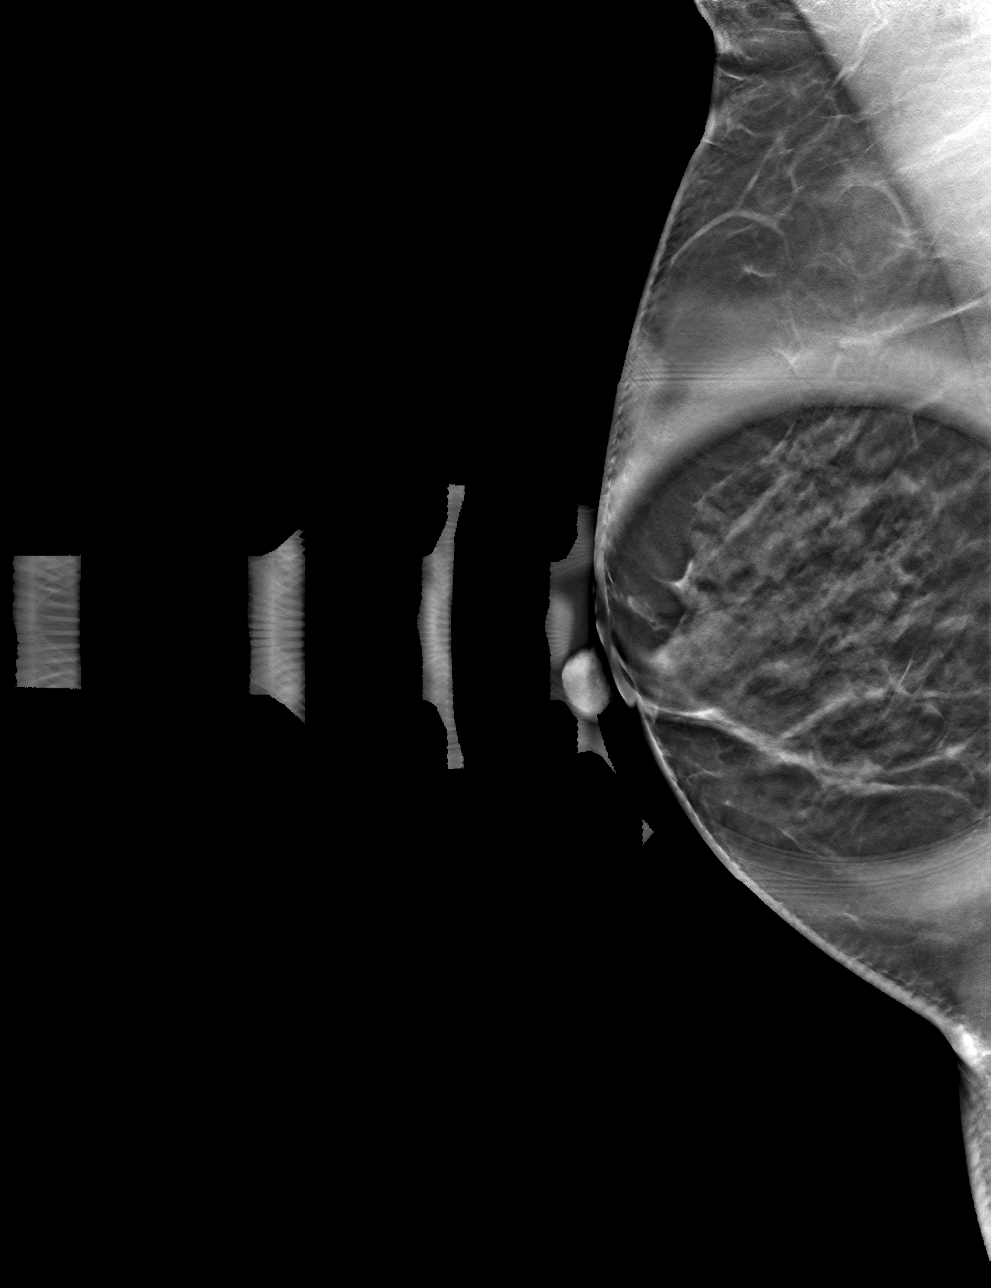

[4 of 12 positions shown; findings below may reference images not displayed]

ACR Breast Density Category c: The breast tissue is heterogeneously
dense, which may obscure small masses.
FINDINGS: Spot-compression CC and MLO views of the area of concern were
obtained.

The focal asymmetry in the outer breast questioned on screening
mammography disperses with compression and there is no underlying
suspicious mass or architectural distortion. A normal intramammary
lymph node containing a solitary punctate calcification is present
in the outer breast, just anterior to the area of concern; this
lymph node is stable when compared to multiple prior mammograms
dating back to [FO], confirming benignity.

No findings suspicious malignancy.
IMPRESSION: No mammographic evidence of malignancy involving the RIGHT breast.

RECOMMENDATION:
Screening mammogram in one year.(Code:[FO])

I have discussed the findings and recommendations with the patient.
If applicable, a reminder letter will be sent to the patient
regarding the next appointment.

BI-RADS CATEGORY  2: Benign.

## 2021-01-24 ENCOUNTER — Ambulatory Visit (INDEPENDENT_AMBULATORY_CARE_PROVIDER_SITE_OTHER): Payer: 59 | Admitting: Gastroenterology

## 2021-01-24 ENCOUNTER — Encounter: Payer: Self-pay | Admitting: Gastroenterology

## 2021-01-24 ENCOUNTER — Ambulatory Visit: Payer: 59 | Admitting: Gastroenterology

## 2021-01-24 VITALS — BP 140/80 | HR 75 | Ht 62.0 in | Wt 129.0 lb

## 2021-01-24 DIAGNOSIS — D734 Cyst of spleen: Secondary | ICD-10-CM

## 2021-01-24 DIAGNOSIS — R1012 Left upper quadrant pain: Secondary | ICD-10-CM

## 2021-01-24 DIAGNOSIS — R9389 Abnormal findings on diagnostic imaging of other specified body structures: Secondary | ICD-10-CM

## 2021-01-24 DIAGNOSIS — R1013 Epigastric pain: Secondary | ICD-10-CM | POA: Diagnosis not present

## 2021-01-24 NOTE — Patient Instructions (Signed)
We will remind you that you will need a MRI scheduled in February 2023  Follow up 1 year  Due to recent changes in healthcare laws, you may see the results of your imaging and laboratory studies on MyChart before your provider has had a chance to review them.  We understand that in some cases there may be results that are confusing or concerning to you. Not all laboratory results come back in the same time frame and the provider may be waiting for multiple results in order to interpret others.  Please give [place] 48 hours in order for your provider to thoroughly review all the results before contacting the office for clarification of your results.    If you are age 14 or older, your body mass index should be between 23-30. Your Body mass index is 23.59 kg/m. If this is out of the aforementioned range listed, please consider follow up with your Primary Care Provider.  If you are age 55 or younger, your body mass index should be between 19-25. Your Body mass index is 23.59 kg/m. If this is out of the aformentioned range listed, please consider follow up with your Primary Care Provider.   __________________________________________________________  The [facility] providers would like to encourage you to use [facility] to communicate with providers for non-urgent requests or questions.  Due to long hold times on the telephone, sending your provider a message by [facility] may be a faster and more efficient way to get a response.  Please allow 48 business hours for a response.  Please remember that this is for non-urgent requests.    I appreciate the  opportunity to care for you  Thank You   Marsa Aris , MD

## 2021-01-24 NOTE — Progress Notes (Signed)
Jill Barry    [id number]    [birth year]  Primary Care Physician:Henson, Zorita Pang, PA-C  Referring Physician: Avanell Shackleton, PA-C No address on file   Chief complaint:  Abnormal MRI/ abdominal pain  HPI:  Jill Barry is a very pleasant 60 year old female here for follow-up visit for e left upper quadrant abdominal discomfort.  Overall she feels her pain is better compared to prior but she continues to have intermittent mild discomfort in the left upper quadrant, worse after a meal.  Denies any vomiting, melena, rectal bleeding or change in bowel habits.  MRI abdomen 12/07/20 Stable 2.8 x 2.5 cm homogeneous, well-defined enhancing lesion in the spleen. While imaging features are nonspecific, this likely represents a benign etiology such as hemangioma or hamartoma. Six-month interval stability is reassuring. One additional follow-up MRI in 6 months is recommended to document 12 months of stability.   EGD 10/01/19 - LA Grade B (one or more mucosal breaks greater than 5 mm, not extending between the tops of two mucosal folds) esophagitis with no bleeding was found 34 to 36 cm from the incisors. - A small hiatal hernia was present. - Patchy mild inflammation characterized by congestion (edema) and erythema was found in the entire examined stomach. Biopsies were taken with a cold forceps for Helicobacter pylori testing. - Patchy mildly erythematous mucosa was found in the duodenal bulb. Biopsies for histology were taken with a cold forceps for evaluation of celiac disease. - The exam was otherwise without abnormality.   1. Surgical [P], duodenal bulb, 2nd portion of duodenum and distal duodenum - BENIGN SMALL BOWEL MUCOSA. - NO VILLOUS BLUNTING OR INCREASE IN INTRAEPITHELIAL LYMPHOCYTES. - NO DYSPLASIA OR MALIGNANCY. 2. Surgical [P], gastric antrum and gastric body - MILD REACTIVE CHANGES. - WARTHIN-STARRY IS NEGATIVE FOR HELICOBACTER PYLORI. - NO INTESTINAL  METAPLASIA, DYSPLASIA, OR MALIGNANCY.     Outpatient Encounter Medications as of 01/24/2021  Medication Sig   albuterol (VENTOLIN HFA) 108 (90 Base) MCG/ACT inhaler Inhale 2 puffs into the lungs every 6 (six) hours as needed for wheezing or shortness of breath.   Calcium Carbonate (CALCIUM 500 PO) Take by mouth.   estradiol (CLIMARA) 0.025 mg/24hr patch Place 1 patch (0.025 mg total) onto the skin once a week.   fluticasone-salmeterol (ADVAIR HFA) 230-21 MCG/ACT inhaler Inhale 2 puffs into the lungs 2 (two) times daily.   Glucos-Chondroit-Hyaluron-MSM (GLUCOSAMINE CHONDROITIN JOINT PO) Take 1 tablet by mouth daily.   progesterone (PROMETRIUM) 100 MG capsule Take 1 capsule (100 mg total) by mouth daily.   [DISCONTINUED] esomeprazole (NEXIUM) 40 MG capsule TAKE 1 CAPSULE (40 MG TOTAL) BY MOUTH DAILY AT 12 NOON.   [DISCONTINUED] dicyclomine (BENTYL) 10 MG capsule TAKE 1 CAPSULE (10 MG TOTAL) BY MOUTH EVERY 6 (SIX) HOURS.   No facility-administered encounter medications on file as of 01/24/2021.    Allergies as of 01/24/2021   (No Known Allergies)    Past Medical History:  Diagnosis Date   Arthritis    Asthma    Heart murmur    Irregular heart beat    PVC's (premature ventricular contractions)     Past Surgical History:  Procedure Laterality Date   NOSE SURGERY  2017   UPPER GASTROINTESTINAL ENDOSCOPY  10/01/2019    Family History  Problem Relation Age of Onset   Hypertension Father    Diabetes Father    Hyperlipidemia Father    Cervical  cancer Sister        Stage IV on chemo   Colon cancer Neg Hx    Colon polyps Neg Hx    Esophageal cancer Neg Hx    Rectal cancer Neg Hx    Stomach cancer Neg Hx     Social History   Socioeconomic History   Marital status: Married    Spouse name: Not on file   Number of children: 2   Years of education: Not on file   Highest education level: Not on file  Occupational History   Occupation: home maker   Tobacco Use   Smoking  status: Never   Smokeless tobacco: Never  Vaping Use   Vaping Use: Never used  Substance and Sexual Activity   Alcohol use: Never   Drug use: Never   Sexual activity: Yes    Partners: Male  Other Topics Concern   Not on file  Social History Narrative   Not on file   Social Determinants of Health   Financial Resource Strain: Not on file  Food Insecurity: Not on file  Transportation Needs: Not on file  Physical Activity: Not on file  Stress: Not on file  Social Connections: Not on file  Intimate Partner Violence: Not on file      Review of systems: All other review of systems negative except as mentioned in the HPI.   Physical Exam: Vitals:   01/24/21 0929  BP: 140/80  Pulse: 75   Body mass index is 23.59 kg/m. Gen:      No acute distress HEENT:  sclera anicteric Abd:      soft, non-tender; no palpable masses, no distension Ext:    No edema Neuro: alert and oriented x 3 Psych: normal mood and affect  Data Reviewed:  Reviewed labs, radiology imaging, old records and pertinent past GI work up   Assessment and Plan/Recommendations:  60 year old very pleasant female with complaints of chronic epigastric and left upper quadrant abdominal discomfort  Abdominal imaging suggestive of 2X3 centimeter benign-appearing lesion in spleen likely hemangioma, was stable in 6 months follow-up imaging.  Recommendation for repeat MRI in 6 to 12 months to document stability.  We will place recall for repeat MRI abdomen in February 2023  Splenic lesion is unlikely to be causing abdominal discomfort, her symptoms are most likely related to dyspepsia and functional abdominal pain Continue with small meals and lifestyle modifications  GERD: Continue antireflux measures  Return in 1 year or sooner if needed  The patient was provided an opportunity to ask questions and all were answered. The patient agreed with the plan and demonstrated an understanding of the instructions.  Jill Barry , MD    CC: Avanell Shackleton, PA-C

## 2021-02-08 ENCOUNTER — Encounter: Payer: Self-pay | Admitting: Nurse Practitioner

## 2021-02-08 ENCOUNTER — Other Ambulatory Visit: Payer: Self-pay

## 2021-02-08 ENCOUNTER — Ambulatory Visit (INDEPENDENT_AMBULATORY_CARE_PROVIDER_SITE_OTHER): Payer: 59 | Admitting: Nurse Practitioner

## 2021-02-08 VITALS — BP 128/74 | Ht 62.5 in | Wt 120.0 lb

## 2021-02-08 DIAGNOSIS — Z79890 Hormone replacement therapy (postmenopausal): Secondary | ICD-10-CM | POA: Diagnosis not present

## 2021-02-08 DIAGNOSIS — Z780 Asymptomatic menopausal state: Secondary | ICD-10-CM | POA: Diagnosis not present

## 2021-02-08 DIAGNOSIS — Z01419 Encounter for gynecological examination (general) (routine) without abnormal findings: Secondary | ICD-10-CM

## 2021-02-08 NOTE — Progress Notes (Signed)
Jill Barry 18-Jul-1960 [id number]   History:  60 y.o. G2P0012 presents for annual exam. No GYN complaints. Postmenopausal - on HRT since 2013 for management of bone pain, mood changes, and hot flashes. Normal pap and mammogram history.   Gynecologic History No LMP recorded. Patient is postmenopausal.   Contraception/Family planning: post menopausal status Sexually active: Yes  Health maintenance Last Pap: 12/09/2019. Results were: Normal, 5-year repeat Last mammogram: 12/28/2020. Results were: Right breast asymmetry, F/U ultrasound 01/18/2021 normal lymph node with calcification (stable since 2017), no evidence of malignancy Last colonoscopy: About 3 years ago. Results were: Normal Last Dexa: 02/29/2016. Results were: Normal  Past medical history, past surgical history, family history and social history were all reviewed and documented in the EPIC chart. Married. 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 Husband is hospitalist. 2 sons, one MD and other starting med school. Sister diagnosed with cervical cancer stage IV.   ROS:  A ROS was performed and pertinent positives and negatives are included.  Exam:  Vitals:   02/08/21 0959  BP: 128/74  Weight: 120 lb (54.4 kg)  Height: 5' 2.5" (1.588 m)    Body mass index is 21.6 kg/m.  General appearance:  Normal Thyroid:  Symmetrical, normal in size, without palpable masses or nodularity. Respiratory  Auscultation:  Clear without wheezing or rhonchi Cardiovascular  Auscultation:  Regular rate, without rubs, murmurs or gallops. Occasional PVCs  Edema/varicosities:  Not grossly evident Abdominal  Soft,nontender, without masses, guarding or rebound.  Liver/spleen:  No organomegaly noted  Hernia:  None appreciated  Skin  Inspection:  Grossly normal   Breasts: Examined lying and sitting.   Right: Without masses, retractions, discharge or axillary adenopathy.   Left: Without masses, retractions, discharge or axillary adenopathy. Genitourinary    Inguinal/mons:  Normal without inguinal adenopathy  External genitalia:  Normal appearing vulva with no masses, tenderness, or lesions  BUS/Urethra/Skene's glands:  Normal  Vagina:  Normal appearing with normal color and discharge, no lesions  Cervix:  Normal appearing without discharge or lesions  Uterus:  Normal in size, shape and contour.  Midline and mobile, nontender  Adnexa/parametria:     Rt: Normal in size, without masses or tenderness.   Lt: Normal in size, without masses or tenderness.  Anus and perineum: Normal  Digital rectal exam: Normal sphincter tone without palpated masses or tenderness  Patient informed chaperone available to be present for breast and pelvic exam. Patient has requested no chaperone to be present. Patient has been advised what will be completed during breast and pelvic exam.    Assessment/Plan:  59 y.o. [id number] as new patient.    Well female exam with routine gynecological exam - Education provided on SBEs, importance of preventative screenings, current guidelines, high calcium diet, regular exercise, and multivitamin daily. Labs with PCP. Her PCP recently left the practice but she is actively looking for new one.   Postmenopausal - on HRT, no bleeding  Hormone replacement therapy - Plan: estradiol (CLIMARA - DOSED IN MG/24 HR) 0.0375 mg/24hr patch, progesterone (PROMETRIUM) 100 MG capsule. We discussed the risks for blood clots, stroke, heart attack, and breast cancer. Her and her husband are aware and would like to continue.   Screening for cervical cancer - Normal Pap history.  Will repeat at 5-year interval per guidelines.  Screening for breast cancer - Normal mammogram history.  Continue annual screenings.  Normal breast exam today.  Screening for colon cancer - Colonoscopy a few years ago.  Will repeat at [facility] recommended  interval.   Screening for osteoporosis - Normal bone density in 2017. She will schedule DXA now.   Follow up in 1 year for  annual.     Olivia Mackie [facility], 10:25 AM 02/08/2021

## 2021-02-22 ENCOUNTER — Ambulatory Visit (INDEPENDENT_AMBULATORY_CARE_PROVIDER_SITE_OTHER): Payer: 59

## 2021-02-22 ENCOUNTER — Other Ambulatory Visit: Payer: Self-pay

## 2021-02-22 ENCOUNTER — Other Ambulatory Visit: Payer: Self-pay | Admitting: Nurse Practitioner

## 2021-02-22 DIAGNOSIS — Z13820 Encounter for screening for osteoporosis: Secondary | ICD-10-CM

## 2021-02-22 DIAGNOSIS — Z780 Asymptomatic menopausal state: Secondary | ICD-10-CM

## 2021-03-20 ENCOUNTER — Other Ambulatory Visit (HOSPITAL_BASED_OUTPATIENT_CLINIC_OR_DEPARTMENT_OTHER): Payer: Self-pay

## 2021-03-20 ENCOUNTER — Other Ambulatory Visit: Payer: Self-pay | Admitting: Nurse Practitioner

## 2021-03-20 DIAGNOSIS — Z79890 Hormone replacement therapy (postmenopausal): Secondary | ICD-10-CM

## 2021-03-20 MED ORDER — PROGESTERONE MICRONIZED 100 MG PO CAPS
100.0000 mg | ORAL_CAPSULE | Freq: Every day | ORAL | 3 refills | Status: DC
  Filled 2021-03-20: qty 90, 90d supply, fill #0
  Filled 2021-06-09 – 2021-06-12 (×2): qty 90, 90d supply, fill #1
  Filled 2021-08-31 – 2021-09-05 (×3): qty 90, 90d supply, fill #2

## 2021-03-20 MED ORDER — ESTRADIOL 0.025 MG/24HR TD PTWK
0.0250 mg | MEDICATED_PATCH | TRANSDERMAL | 3 refills | Status: DC
  Filled 2021-03-20: qty 12, 84d supply, fill #0
  Filled 2021-06-09: qty 12, 84d supply, fill #1
  Filled 2021-08-31 – 2021-09-05 (×2): qty 12, 84d supply, fill #2

## 2021-03-20 NOTE — Telephone Encounter (Signed)
Annual exam was on 02/08/21

## 2021-03-21 ENCOUNTER — Other Ambulatory Visit (HOSPITAL_BASED_OUTPATIENT_CLINIC_OR_DEPARTMENT_OTHER): Payer: Self-pay

## 2021-03-23 ENCOUNTER — Telehealth: Payer: Self-pay | Admitting: Cardiology

## 2021-03-23 NOTE — Telephone Encounter (Signed)
Pt was calling to make an appt for PVCs she's had for over 2 years, but more apparent over last 3 weeks.  Pt denies any cardiac complaints like chest pain, sob, doe, dizziness, diaphoresis, N/V, pre-syncopal or syncopal episodes.  Pt monitors her rates ad pressures and HR runs in the 70s and she said last pressure was 130/70.  She states she feels fine, just feels occasional fluttering.  Does have history of PVCs in past.  Advised her to limit caffeine and chocolate, hydrate well, and continue to monitor.  She is scheduled for this to see Gillian Shields NP for next Tuesday 12/20 at 0850.  Appt made by scheduling.   Will forward this message to Dr. Shari Prows as an FYI to make her aware of this plan. Pt verbalized understanding and agrees with this plan. Pt was more than gracious for all the assistance provided.

## 2021-03-23 NOTE — Telephone Encounter (Signed)
Pt c/o of Chest Pain: STAT if CP now or developed within 24 hours  1. Are you having CP right now? No   2. Are you experiencing any other symptoms (ex. SOB, nausea, vomiting, sweating)? Palpitations   3. How long have you been experiencing CP? 2-3 months   4. Is your CP continuous or coming and going? Coming and going, about 1-2 times a week   5. Have you taken Nitroglycerin? No  ?   Spoke with husband scheduled for 03/28/21 with [facility] in regards to this. Request his wife be called back instead of him with the number listed in the contact info for this telephone note.

## 2021-03-27 NOTE — Progress Notes (Addendum)
Office Visit    Patient Name: Jill Barry Date of Encounter: 04/27/2021  PCP:  Ishmael Holter   [facility]  Cardiologist:  Meriam Sprague, MD  Advanced Practice Provider:  No care team member to display Electrophysiologist:  None      Chief Complaint    Jill Barry is a 60 y.o. female with a hx of palpitations, PVC, gastritis  presents today for palpitations   Past Medical History    Past Medical History:  Diagnosis Date   Arthritis    Asthma    Heart murmur    Irregular heart beat    PVC's (premature ventricular contractions)    Past Surgical History:  Procedure Laterality Date   NOSE SURGERY  2017   UPPER GASTROINTESTINAL ENDOSCOPY  10/01/2019    Allergies  No Known Allergies  History of Present Illness    Jill Barry is a 60 y.o. female with a hx of palpitations, PVC, gastritis last seen 02/2020 by Dr. Shari Prows.  Seen in consult 03/02/20 by Dr. Shari Prows due to palpitations. They initiated April 2020. She underwent cardiac monitor showing frequent PVC, no atrial fib, no SVT. She trialed BB but no improvement in palpitations and worsened abdominal pain. She tried potassium and magnesium supplement which irritated her stomach. PVCs were worse after eating and improved with reclining. 7 day ZIO placed showing PVC burden 6.2%. EP consult recommended though patient cancelled.   She presents today for follow up.  Notes for the past 2 weeks she has had increased palpitations.  She shares with me that her HRT dose was recently reduced and she is hopeful that will improve her symptoms.  She has had a chest tightness in her left shoulder for about 2 weeks that occurs intermittently both at rest and with activity.  No noted aggravating or relieving factors.  She has never had prior ischemic work-up. Reports no shortness of breath nor dyspnea on exertion.  No edema, orthopnea, PND.    EKGs/Labs/Other Studies Reviewed:   The following studies  were reviewed today:  ZIO 02/2020 Cardiac monitor worn for 6days 23hours Predominant rhythm was normal sinus rhythm with average 73bpm (ranged from 51bpm to 155bpm) 1 episode of ventricular trigeminy lasting 43sec; 1 episode of ventricular bigeminy of 39 sec There were frequent PVCs (32355, 6.2%) which frequently correlated with triggered events No Afib, SVT, or significant pauses   TTE 09/16/19:  1. Left ventricular ejection fraction, by estimation, is 60 to 65%. The  left ventricle has normal function. The left ventricle has no regional  wall motion abnormalities. Left ventricular diastolic parameters are  consistent with Grade I diastolic  dysfunction (impaired relaxation).   2. Right ventricular systolic function is normal. The right ventricular  size is normal. There is normal pulmonary artery systolic pressure.   3. The mitral valve is normal in structure. Trivial mitral valve  regurgitation. No evidence of mitral stenosis.   4. The aortic valve is normal in structure. Aortic valve regurgitation is  not visualized. No aortic stenosis is present.   5. The inferior vena cava is normal in size with greater than 50%  respiratory variability, suggesting right atrial pressure of 3 mmHg.   EKG:  EKG is ordered today.  The ekg ordered today demonstrates SB 58 bpm with no acute ST/T wave changes.   Recent Labs: 05/19/2020: ALT 16 03/28/2021: BUN 11; Creatinine, Ser 0.82; Potassium 4.0; Sodium 139  Recent Lipid Panel    Component Value  Date/Time   CHOL 218 (H) 03/28/2021 0919   TRIG 103 03/28/2021 0919   HDL 54 03/28/2021 0919   CHOLHDL 4.0 03/28/2021 0919   LDLCALC 146 (H) 03/28/2021 0919     Home Medications   Current Meds  Medication Sig   albuterol (VENTOLIN HFA) 108 (90 Base) MCG/ACT inhaler Inhale 2 puffs into the lungs every 6 (six) hours as needed for wheezing or shortness of breath.   Calcium Carbonate (CALCIUM 500 PO) Take by mouth.   estradiol (CLIMARA) 0.025  mg/24hr patch Place 1 patch (0.025 mg total) onto the skin once a week.   Glucos-Chondroit-Hyaluron-MSM (GLUCOSAMINE CHONDROITIN JOINT PO) Take 1 tablet by mouth daily.   progesterone (PROMETRIUM) 100 MG capsule Take 1 capsule (100 mg total) by mouth daily.     Review of Systems      All other systems reviewed and are otherwise negative except as noted above.  Physical Exam    VS:  BP 122/62    Pulse (!) 58    Ht 5\' 2"  (1.575 m)    Wt 127 lb 1.6 oz (57.7 kg)    BMI 23.25 kg/m  , BMI Body mass index is 23.25 kg/m.  Wt Readings from Last 3 Encounters:  03/28/21 127 lb 1.6 oz (57.7 kg)  02/08/21 120 lb (54.4 kg)  01/24/21 129 lb (58.5 kg)     GEN: Well nourished, well developed, in no acute distress. HEENT: normal. Neck: Supple, no JVD, carotid bruits, or masses. Cardiac: RRR, no murmurs, rubs, or gallops. No clubbing, cyanosis, edema.  Radials/PT 2+ and equal bilaterally.  Respiratory:  Respirations regular and unlabored, clear to auscultation bilaterally. GI: Soft, nontender, nondistended. MS: No deformity or atrophy. Skin: Warm and dry, no rash. Neuro:  Strength and sensation are intact. Psych: Normal affect.  Assessment & Plan    Chest pain - 2 week history of left sided chest pain described as pressure with and without activity. EKG today with no acute ST/T wave changes. No previous ischemic evaluation. Risk factors include chest pain, HRT therapy, family history. Plan for cardiac CTA to rule out coronary artery disease. BMP and lipid panel today. Given baseline bradycardia and previous intolerance of BB will not utilize Metoprolol prior to testing.   Palpitations / PVC - Previously trialed BB, CCB, magnesium, potassium supplementation without significant improvement. Echo 09/2019 normal BiV function with no significant valvular abnormalities. Most symptomatic after meals. Monitor 03/2020 with PVC 6.2% burden. EP consult scheduled and patient cancelled. Prefers to remain on HRT  due to severe joint pain if discontinued.  Notes some improvement since reduced dose of HRT therapy and will contact 04/2020 if she wishes to pursue EP evaluation.   Gastritis - Continue to follow with PCP.   Disposition: Follow up in 6 month(s) with [place], MD or APP. Sooner follow up will be arranged if cardiac CT with concerning findings.   Signed, Meriam Sprague, NP 04/27/2021, 8:13 AM [facility]

## 2021-03-28 ENCOUNTER — Other Ambulatory Visit: Payer: Self-pay

## 2021-03-28 ENCOUNTER — Encounter (HOSPITAL_BASED_OUTPATIENT_CLINIC_OR_DEPARTMENT_OTHER): Payer: Self-pay | Admitting: Family

## 2021-03-28 ENCOUNTER — Ambulatory Visit (INDEPENDENT_AMBULATORY_CARE_PROVIDER_SITE_OTHER): Payer: 59 | Admitting: Family

## 2021-03-28 VITALS — BP 122/62 | HR 58 | Ht 62.0 in | Wt 127.1 lb

## 2021-03-28 DIAGNOSIS — R002 Palpitations: Secondary | ICD-10-CM | POA: Diagnosis not present

## 2021-03-28 DIAGNOSIS — Z8719 Personal history of other diseases of the digestive system: Secondary | ICD-10-CM

## 2021-03-28 DIAGNOSIS — R072 Precordial pain: Secondary | ICD-10-CM | POA: Diagnosis not present

## 2021-03-28 DIAGNOSIS — I493 Ventricular premature depolarization: Secondary | ICD-10-CM

## 2021-03-28 DIAGNOSIS — R079 Chest pain, unspecified: Secondary | ICD-10-CM

## 2021-03-28 DIAGNOSIS — Z79890 Hormone replacement therapy (postmenopausal): Secondary | ICD-10-CM

## 2021-03-28 NOTE — Patient Instructions (Addendum)
Medication Instructions:  Continue your current medications.   *If you need a refill on your cardiac medications before your next appointment, please call your pharmacy*   Lab Work: Your physician recommends that you return for lab work today: BMP, lipid panel  If you have labs (blood work) drawn today and your tests are completely normal, you will receive your results only by: MyChart Message (if you have MyChart) OR A paper copy in the mail If you have any lab test that is abnormal or we need to change your treatment, we will call you to review the results.   Testing/Procedures: Your physician has requested that you have cardiac CT. Cardiac computed tomography (CT) is a painless test that uses an x-ray machine to take clear, detailed pictures of your heart. Please follow instruction sheet as given.    Follow-Up: At [facility], you and your health needs are our priority.  As part of our continuing mission to provide you with exceptional heart care, we have created designated Provider Care Teams.  These Care Teams include your primary Cardiologist (physician) and Advanced Practice Providers (APPs -  Physician Assistants and Nurse Practitioners) who all work together to provide you with the care you need, when you need it.  We recommend signing up for the patient portal called "MyChart".  Sign up information is provided on this After Visit Summary.  MyChart is used to connect with patients for Virtual Visits (Telemedicine).  Patients are able to view lab/test results, encounter notes, upcoming appointments, etc.  Non-urgent messages can be sent to your provider as well.   To learn more about what you can do with MyChart, go to [email or web address].    Your next appointment:   6 month(s)  The format for your next appointment:   In Person  Provider:   Meriam Sprague, MD or Advanced Practice Provider   Other Instructions    Your cardiac CT will be scheduled at one of the  below locations:   [facility] [address] [place], [place] [address] [phone]  If scheduled at [facility], please arrive at the [facility] main entrance (entrance A) of [facility] 30 minutes prior to test start time. You can use the FREE valet parking offered at the main entrance (encouraged to control the heart rate for the test) Proceed to the [facility] Radiology Department (first floor) to check-in and test prep.   Please follow these instructions carefully (unless otherwise directed):   On the Day of the Test: Drink plenty of water until 1 hour prior to the test. Do not eat any food 4 hours prior to the test. You may take your regular medications prior to the test.  Take metoprolol (Lopressor) two hours prior to test. FEMALES- please wear underwire-free bra if available, avoid dresses & tight clothing      After the Test: Drink plenty of water. After receiving IV contrast, you may experience a mild flushed feeling. This is normal. On occasion, you may experience a mild rash up to 24 hours after the test. This is not dangerous. If this occurs, you can take Benadryl 25 mg and increase your fluid intake. If you experience trouble breathing, this can be serious. If it is severe call 911 IMMEDIATELY. If it is mild, please call our office. If you take any of these medications: Glipizide/Metformin, Avandament, Glucavance, please do not take 48 hours after completing test unless otherwise instructed.  Please allow 2-4 weeks for scheduling  of routine cardiac CTs. Some insurance companies require a pre-authorization which may delay scheduling of this test.   For non-scheduling related questions, please contact the cardiac imaging nurse navigator should you have any questions/concerns: Rockwell Alexandria, Cardiac Imaging Nurse Navigator Larey Brick, Cardiac Imaging Nurse Navigator [facility] Direct Office Dial:  [phone]   For scheduling needs, including cancellations and rescheduling, please call [place], [phone].

## 2021-03-28 NOTE — Addendum Note (Signed)
Addended by: Marlene Lard on: 03/28/2021 03:40 PM   Modules accepted: Orders

## 2021-03-29 ENCOUNTER — Encounter (HOSPITAL_BASED_OUTPATIENT_CLINIC_OR_DEPARTMENT_OTHER): Payer: Self-pay

## 2021-03-29 LAB — BASIC METABOLIC PANEL
BUN/Creatinine Ratio: 13 (ref 12–28)
BUN: 11 mg/dL (ref 8–27)
CO2: 26 mmol/L (ref 20–29)
Calcium: 9.6 mg/dL (ref 8.7–10.3)
Chloride: 100 mmol/L (ref 96–106)
Creatinine, Ser: 0.82 mg/dL (ref 0.57–1.00)
Glucose: 109 mg/dL — ABNORMAL HIGH (ref 70–99)
Potassium: 4 mmol/L (ref 3.5–5.2)
Sodium: 139 mmol/L (ref 134–144)
eGFR: 82 mL/min/{1.73_m2} (ref >59)

## 2021-03-29 LAB — LIPID PANEL
Chol/HDL Ratio: 4 ratio (ref 0.0–4.4)
Cholesterol, Total: 218 mg/dL — ABNORMAL HIGH (ref 100–199)
HDL: 54 mg/dL (ref >39)
LDL Chol Calc (NIH): 146 mg/dL — ABNORMAL HIGH (ref 0–99)
Triglycerides: 103 mg/dL (ref 0–149)
VLDL Cholesterol Cal: 18 mg/dL (ref 5–40)

## 2021-04-04 NOTE — Progress Notes (Signed)
Seen by patient Jill Barry on 04/01/2021  4:35 PM

## 2021-04-06 ENCOUNTER — Telehealth (HOSPITAL_COMMUNITY): Payer: Self-pay | Admitting: Emergency Medicine

## 2021-04-06 NOTE — Telephone Encounter (Signed)
Reaching out to patient to offer assistance regarding upcoming cardiac imaging study; pt verbalizes understanding of appt date/time, parking situation and where to check in, pre-test NPO status and medications ordered, and verified current allergies; name and call back number provided for further questions should they arise Rockwell Alexandria RN Navigator Cardiac Imaging [facility] [phone] office [phone] cell  Denies iv issues Daily meds per usual Arrival 330p

## 2021-04-07 ENCOUNTER — Other Ambulatory Visit: Payer: Self-pay

## 2021-04-07 ENCOUNTER — Ambulatory Visit (HOSPITAL_COMMUNITY)
Admission: RE | Admit: 2021-04-07 | Discharge: 2021-04-07 | Disposition: A | Discharge status: Home / Self Care | Payer: 59 | Source: Ambulatory Visit | Attending: Family | Admitting: Family

## 2021-04-07 DIAGNOSIS — R072 Precordial pain: Secondary | ICD-10-CM | POA: Insufficient documentation

## 2021-04-07 IMAGING — CT CT HEART MORP W/ CTA COR W/ SCORE W/ CA W/CM &/OR W/O CM
4 of 7 series · 8 of 20 positions shown, 9 images · IV contrast (APPLIED)
Comparison: None.

Addendum:
CLINICAL DATA: This is a 60 year old male with anginal symptoms.

EXAM:
Cardiac/Coronary  CTA
TECHNIQUE: The patient was scanned on a [employer] Force scanner.

[Series 7: best diast 67 % · axial · 0.38mm/px · z∈[+1056,+1104]mm · 2 of 354 slices shown, 3 images]
[im 118/354  vessel]
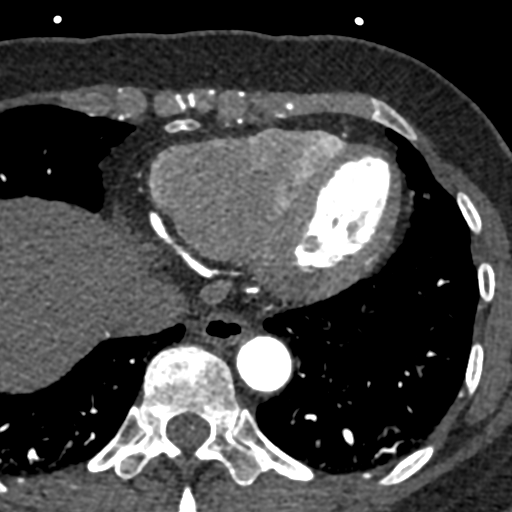
[im 118/354  lung]
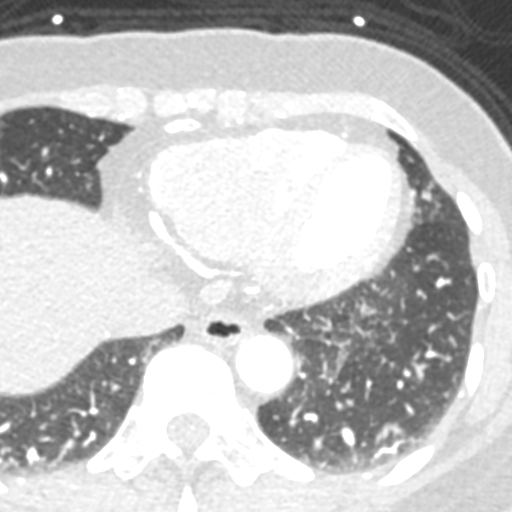
[im 236/354  vessel]
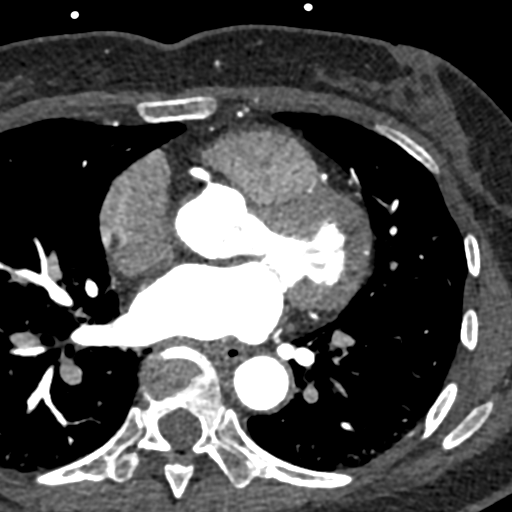

[Series 10: ts syst 37 % · axial · 0.38mm/px · z∈[+1056,+1104]mm · 2 of 354 slices shown]
[im 118/354  vessel]
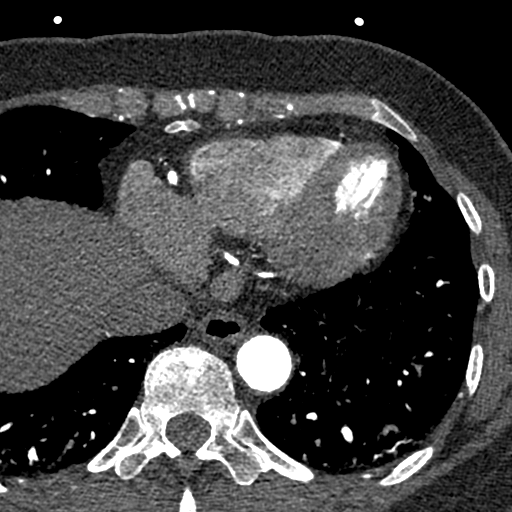
[im 236/354  vessel]
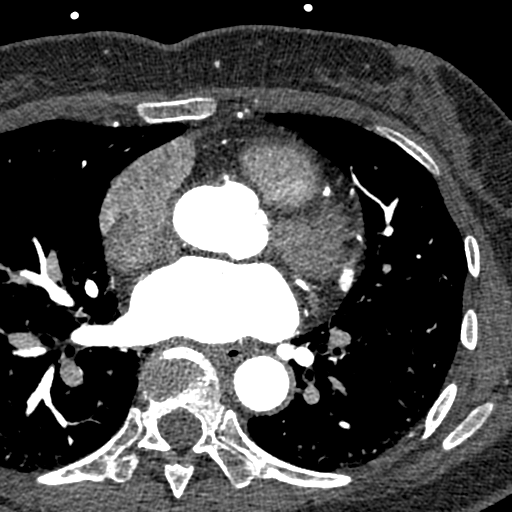

[Series 12: ts diast 67 % · axial · 0.38mm/px · z∈[+1056,+1104]mm · 2 of 354 slices shown]
[im 118/354  vessel]
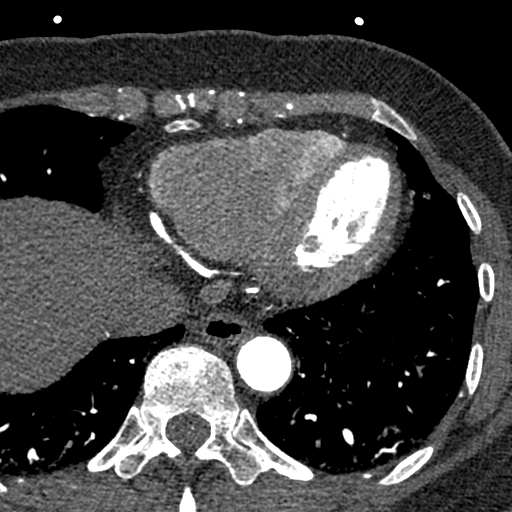
[im 236/354  vessel]
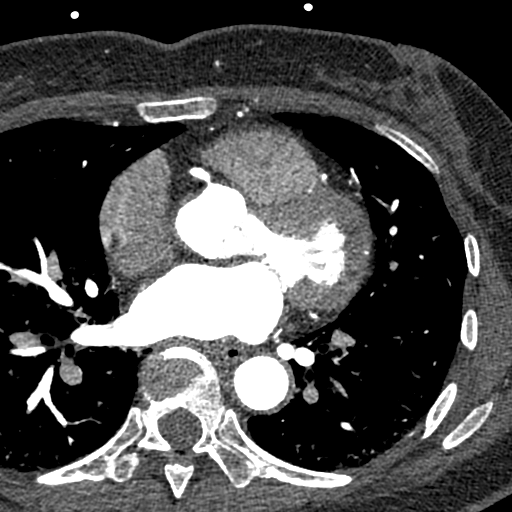

[Series 13: best syst 37 % · axial · 0.38mm/px · z∈[+1056,+1104]mm · 2 of 354 slices shown]
[im 118/354  vessel]
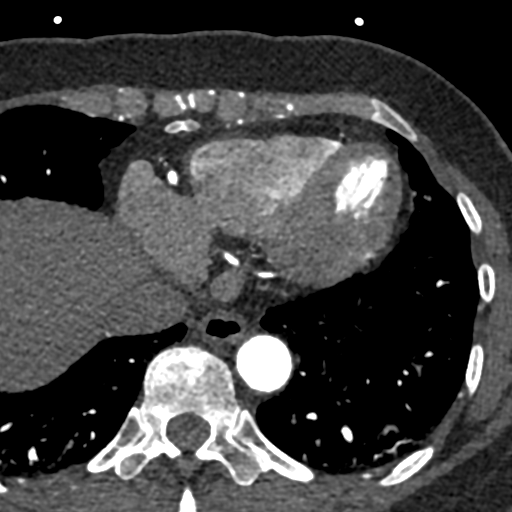
[im 236/354  vessel]
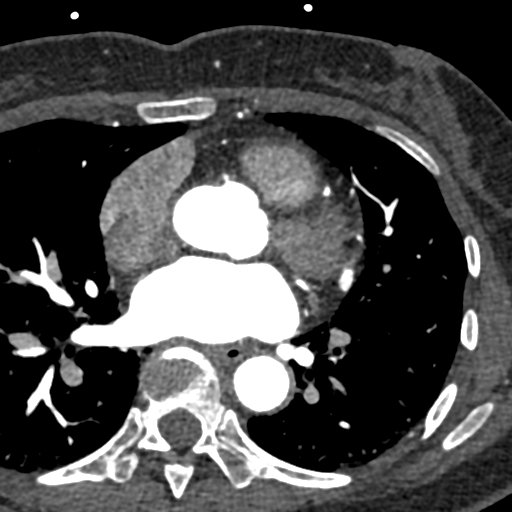

[8 of 20 positions shown; findings below may reference images not displayed]



Aorta: Normal size.  No calcifications.  No dissection.

Aortic Valve:  Trileaflet.  No calcifications.

Coronary Arteries:  Normal coronary origin.  Right dominance.

RCA is a large dominant artery that gives rise to PDA and PLA. There
is no plaque.

Left main is a large artery that gives rise to LAD and LCX arteries.

LAD is a large vessel that has no plaque.

LCX is a non-dominant artery that gives rise to one large OM1
branch. There is no plaque.

Coronary Calcium Score:

Left main: 0

Left anterior descending artery: 0

Left circumflex artery: 0

Right coronary artery: 0

Total: 0

Percentile: 0

Other findings:

Normal pulmonary vein drainage into the left atrium.

Normal left atrial appendage without a thrombus.

Normal size of the pulmonary artery.
IMPRESSION: 1. Coronary calcium score of 0. This was 0 percentile for age and
sex matched control.

2. Normal coronary origin with right dominance.

3. No evidence of CAD. CAD-RADS 0. No evidence of CAD (0%). Consider
non-atherosclerotic causes of chest pain.

This non-cardiac portion of this study will be over-read by the
radiologist.

EXAM:
OVER-READ INTERPRETATION  CT CHEST

The following report is an over-read performed by radiologist Dr.
over-read does not include interpretation of cardiac or coronary
anatomy or pathology. The coronary CTA interpretation by the
cardiologist is attached.
FINDINGS: No significant noncardiac vascular findings. Visualized mediastinum
and hilar regions demonstrate no lymphadenopathy or masses.
Visualized lungs show no evidence of pulmonary edema, consolidation,
pneumothorax, nodule or pleural fluid. Visualized upper abdomen and
bony structures are unremarkable.
IMPRESSION: No significant incidental findings.

*** End of Addendum ***
FINDINGS: A 100 kV prospective scan was triggered in the descending thoracic
aorta at 111 HU's. Axial non-contrast 3 mm slices were carried out
through the heart. The data set was analyzed on a dedicated work
station and scored using the Agatson method. Gantry rotation speed
was 250 msecs and collimation was .6 mm. No beta blockade and 0.8 mg
of sl NTG was given. The 3D data set was reconstructed in 5%
intervals of the 67-82 % of the R-R cycle. Diastolic phases were
analyzed on a dedicated work station using MPR, MIP and VRT modes.
The patient received 80 cc of contrast.

Aorta: Normal size.  No calcifications.  No dissection.

Aortic Valve:  Trileaflet.  No calcifications.

Coronary Arteries:  Normal coronary origin.  Right dominance.

RCA is a large dominant artery that gives rise to PDA and PLA. There
is no plaque.

Left main is a large artery that gives rise to LAD and LCX arteries.

LAD is a large vessel that has no plaque.

LCX is a non-dominant artery that gives rise to one large OM1
branch. There is no plaque.

Coronary Calcium Score:

Left main: 0

Left anterior descending artery: 0

Left circumflex artery: 0

Right coronary artery: 0

Total: 0

Percentile: 0

Other findings:

Normal pulmonary vein drainage into the left atrium.

Normal left atrial appendage without a thrombus.

Normal size of the pulmonary artery.
IMPRESSION: 1. Coronary calcium score of 0. This was 0 percentile for age and
sex matched control.

2. Normal coronary origin with right dominance.

3. No evidence of CAD. CAD-RADS 0. No evidence of CAD (0%). Consider
non-atherosclerotic causes of chest pain.

This non-cardiac portion of this study will be over-read by the
radiologist.

## 2021-04-07 MED ORDER — METOPROLOL TARTRATE 5 MG/5ML IV SOLN
INTRAVENOUS | Status: AC
  Filled 2021-04-07: qty 20

## 2021-04-07 MED ORDER — METOPROLOL TARTRATE 5 MG/5ML IV SOLN
10.0000 mg | INTRAVENOUS | Status: DC | PRN
  Administered 2021-04-07: 16:00:00 10 mg via INTRAVENOUS

## 2021-04-07 MED ORDER — NITROGLYCERIN 0.4 MG SL SUBL
0.8000 mg | SUBLINGUAL_TABLET | Freq: Once | SUBLINGUAL | Status: AC
  Administered 2021-04-07: 16:00:00 0.8 mg via SUBLINGUAL

## 2021-04-07 MED ORDER — IOHEXOL 350 MG/ML SOLN
100.0000 mL | Freq: Once | INTRAVENOUS | Status: AC | PRN
  Administered 2021-04-07: 16:00:00 100 mL via INTRAVENOUS

## 2021-04-07 MED ORDER — NITROGLYCERIN 0.4 MG SL SUBL
SUBLINGUAL_TABLET | SUBLINGUAL | Status: AC
  Filled 2021-04-07: qty 2

## 2021-04-11 ENCOUNTER — Encounter (HOSPITAL_BASED_OUTPATIENT_CLINIC_OR_DEPARTMENT_OTHER): Payer: Self-pay

## 2021-04-14 ENCOUNTER — Ambulatory Visit (HOSPITAL_COMMUNITY): Payer: 59

## 2021-04-14 ENCOUNTER — Telehealth (HOSPITAL_BASED_OUTPATIENT_CLINIC_OR_DEPARTMENT_OTHER): Payer: Self-pay

## 2021-04-14 NOTE — Telephone Encounter (Signed)
Results called to patient and left on voicemail with call back number if patient has any questions!

## 2021-04-26 ENCOUNTER — Encounter (HOSPITAL_BASED_OUTPATIENT_CLINIC_OR_DEPARTMENT_OTHER): Payer: Self-pay

## 2021-05-01 ENCOUNTER — Telehealth (HOSPITAL_BASED_OUTPATIENT_CLINIC_OR_DEPARTMENT_OTHER): Payer: Self-pay

## 2021-05-01 NOTE — Telephone Encounter (Signed)
Followed up on unread mychart message. Patient and husband were able to print most recent office visit and send it to insurance company. They will reach out to [place] if they need more information from [place]!

## 2021-06-08 ENCOUNTER — Other Ambulatory Visit (HOSPITAL_BASED_OUTPATIENT_CLINIC_OR_DEPARTMENT_OTHER): Payer: Self-pay

## 2021-06-08 MED ORDER — AZITHROMYCIN 250 MG PO TABS
ORAL_TABLET | ORAL | 0 refills | Status: DC
  Filled 2021-06-08: qty 6, 5d supply, fill #0

## 2021-06-09 ENCOUNTER — Other Ambulatory Visit (HOSPITAL_BASED_OUTPATIENT_CLINIC_OR_DEPARTMENT_OTHER): Payer: Self-pay

## 2021-06-12 ENCOUNTER — Other Ambulatory Visit (HOSPITAL_BASED_OUTPATIENT_CLINIC_OR_DEPARTMENT_OTHER): Payer: Self-pay

## 2021-06-19 ENCOUNTER — Telehealth: Payer: Self-pay

## 2021-06-19 ENCOUNTER — Other Ambulatory Visit: Payer: Self-pay

## 2021-06-19 DIAGNOSIS — D7389 Other diseases of spleen: Secondary | ICD-10-CM

## 2021-06-19 NOTE — Telephone Encounter (Signed)
Patient is due the 6 month follow up abdominal MRI W/WO for interval imaging of the lesion on the spleen. Order placed and scheduling notified. ?

## 2021-08-11 ENCOUNTER — Encounter (HOSPITAL_COMMUNITY): Payer: Self-pay | Admitting: Emergency Medicine

## 2021-08-11 ENCOUNTER — Ambulatory Visit (HOSPITAL_COMMUNITY)
Admission: EM | Admit: 2021-08-11 | Discharge: 2021-08-11 | Disposition: A | Discharge status: Home / Self Care | Payer: 59 | Attending: Family Medicine | Admitting: Family Medicine

## 2021-08-11 ENCOUNTER — Ambulatory Visit (INDEPENDENT_AMBULATORY_CARE_PROVIDER_SITE_OTHER): Payer: 59

## 2021-08-11 DIAGNOSIS — M1711 Unilateral primary osteoarthritis, right knee: Secondary | ICD-10-CM

## 2021-08-11 DIAGNOSIS — M25561 Pain in right knee: Secondary | ICD-10-CM

## 2021-08-11 IMAGING — DX DG KNEE COMPLETE 4+V*R*
4 series · 4 of 4 positions shown · non-contrast
Comparison: None Available.

CLINICAL DATA: Heard a pop when bending leg the step upward. Pain
with extension with walking.

EXAM:
RIGHT KNEE - COMPLETE 4+ VIEW

[knee ap]
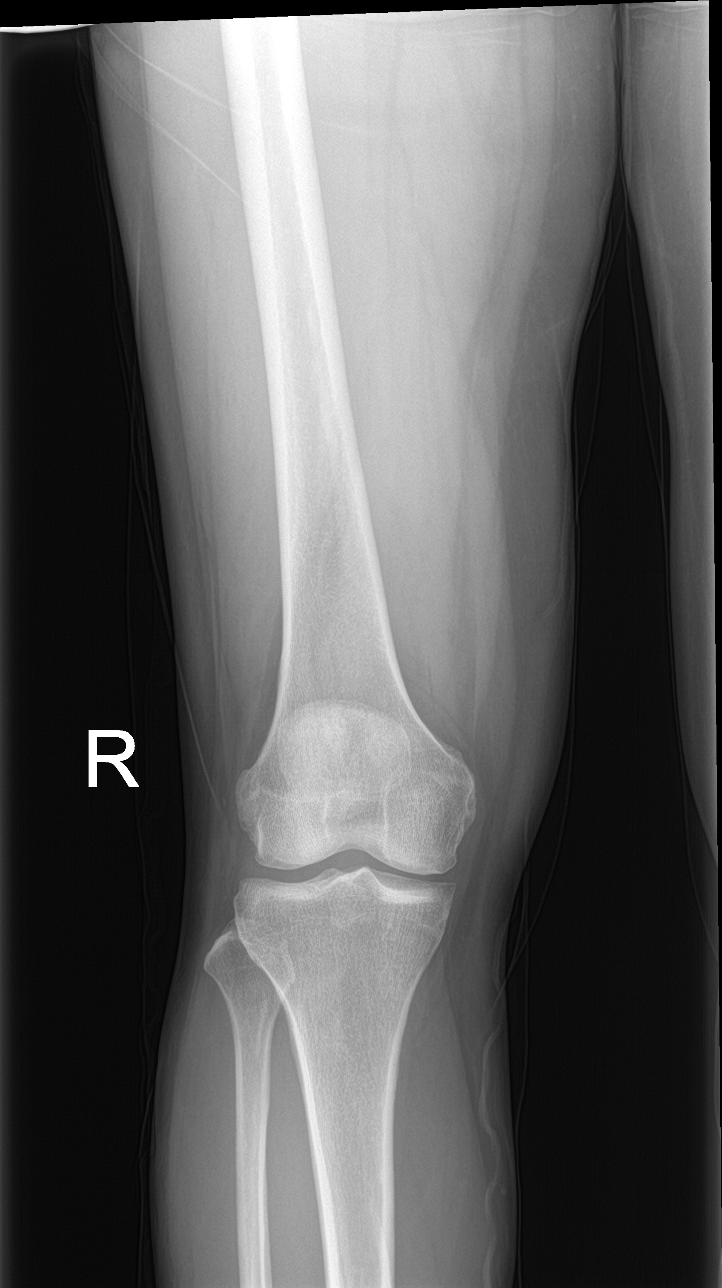

[knee obl (1 of 2)]
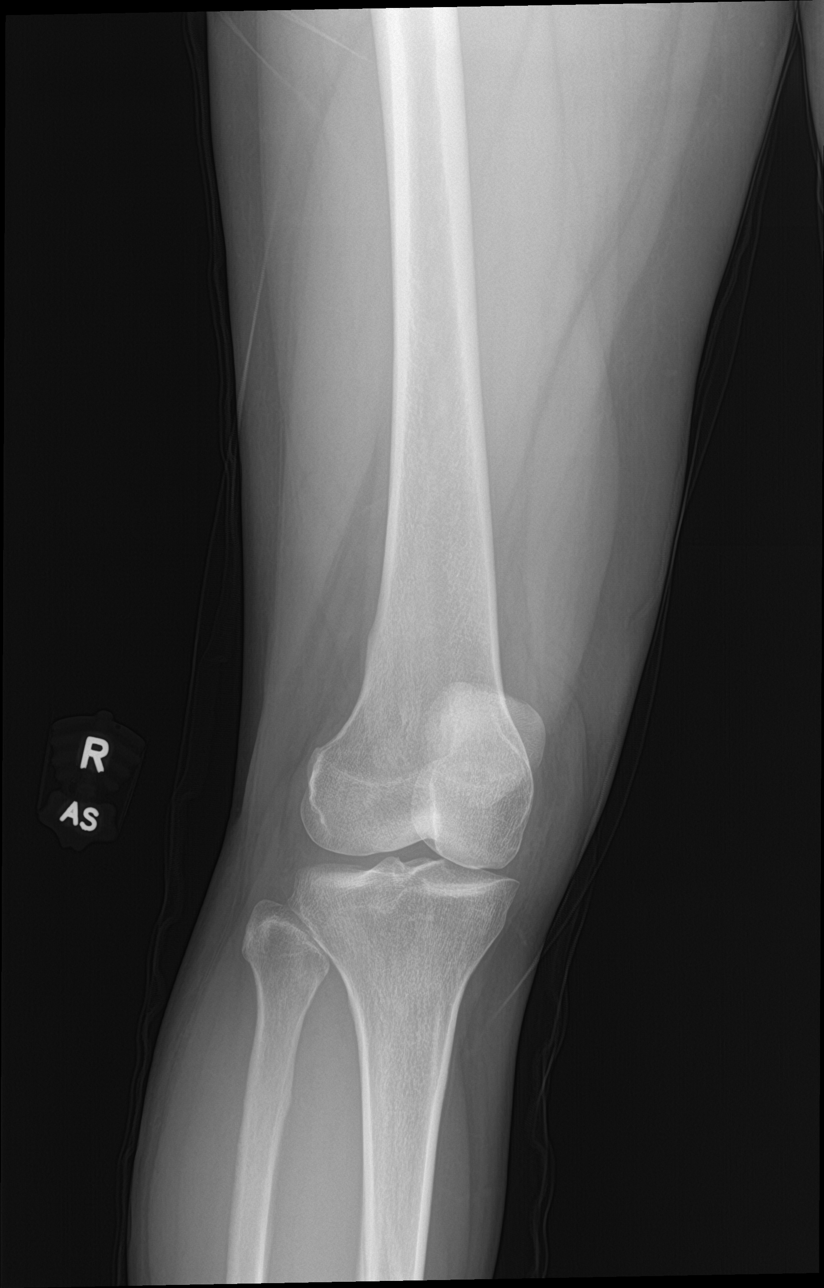

[knee obl (2 of 2)]
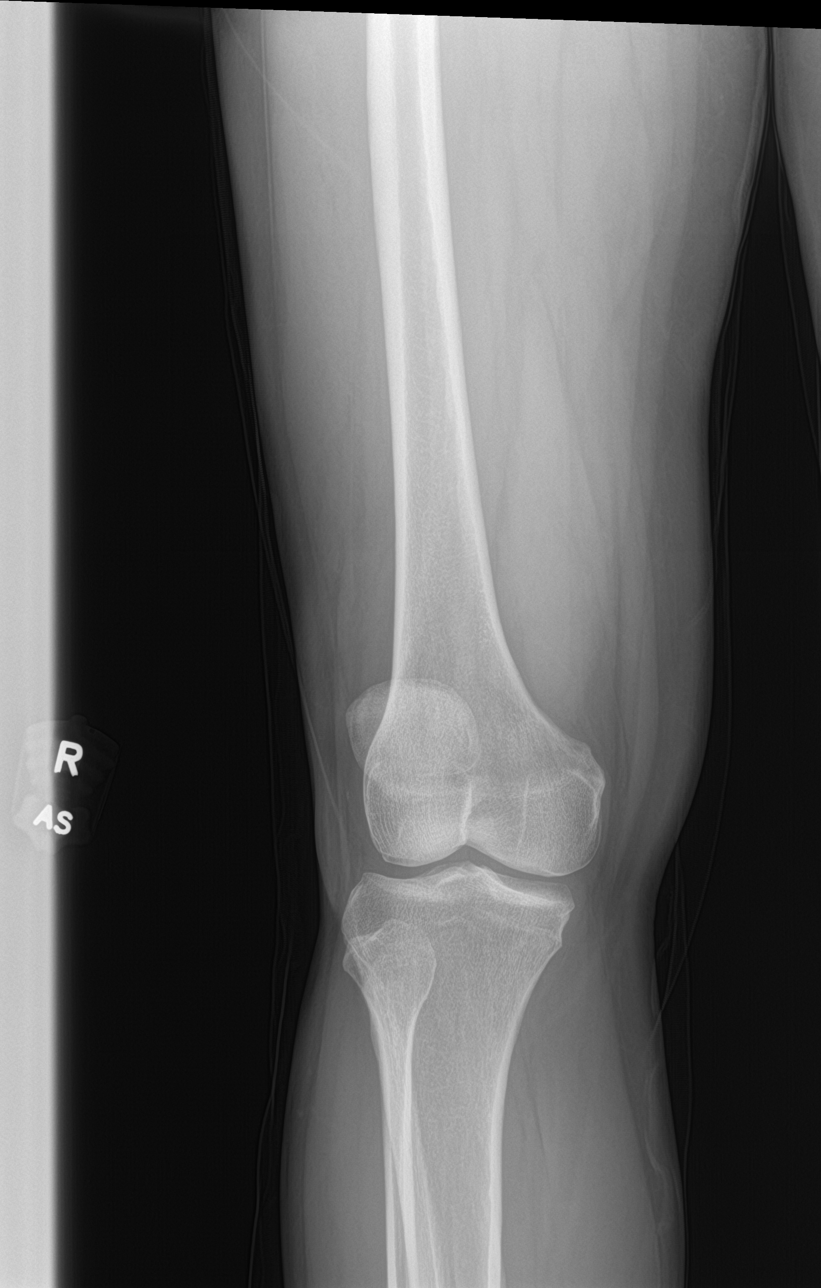

[knee lat]
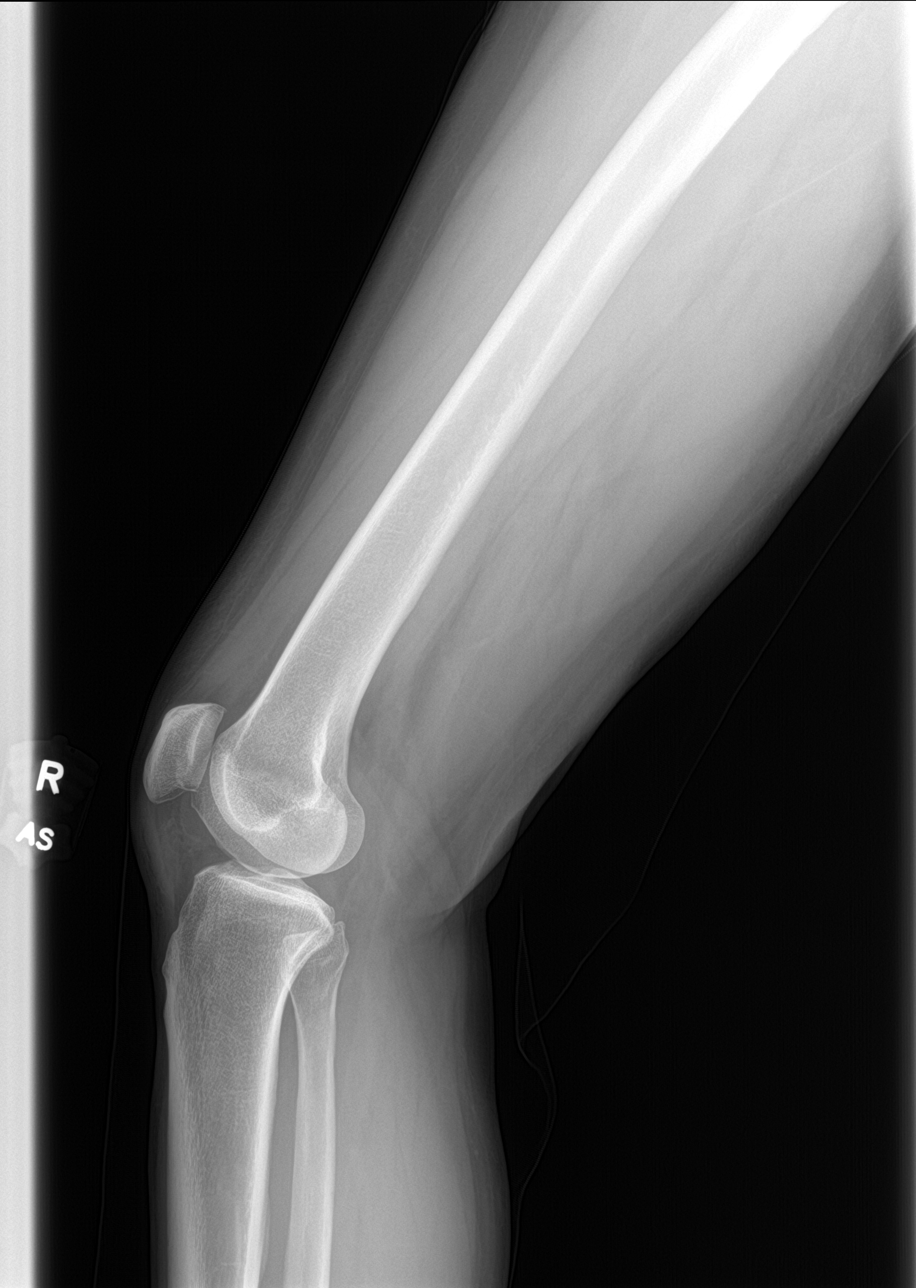

[4 of 4 positions shown; findings below may reference images not displayed]

FINDINGS: Minimal medial compartment joint space narrowing and peripheral
degenerative spurring. No joint effusion. No acute fracture or
dislocation.
IMPRESSION: Minimal medial compartment osteoarthritis.

## 2021-08-11 NOTE — ED Provider Notes (Addendum)
?[facility] ? ? ? ?CSN: [id number] ?Arrival date & time: 08/11/21  1833 ? ? ?  ? ?History   ?Chief Complaint ?Chief Complaint  ?Patient presents with  ? Leg Pain  ? ? ?HPI ?Jill Barry is a 61 y.o. female.  ? ?HPI ?Patient presents with right posterior knee pain and noticing a pop when she was walking in a step of motion.  She is status post right lower leg injury while playing tennis and has already been seen and evaluated by orthopedics for that injury and has subsequently improved.  She endorses pain with stretching out her right leg although no pain with bending her knee.  She has some mild swelling.  She has not taken any medication today for pain. ? ?Past Medical History:  ?Diagnosis Date  ? Arthritis   ? Asthma   ? Heart murmur   ? Irregular heart beat   ? PVC's (premature ventricular contractions)   ? ? ?Patient Active Problem List  ? Diagnosis Date Noted  ? Mild intermittent asthma without complication [id number]  ? Palpitations 08/20/2019  ? Frequent PVCs 08/20/2019  ? ? ?Past Surgical History:  ?Procedure Laterality Date  ? NOSE SURGERY  2017  ? UPPER GASTROINTESTINAL ENDOSCOPY  10/01/2019  ? ? ?OB History   ? ? Gravida  ?2  ? Para  ?   ? Term  ?   ? Preterm  ?   ? AB  ?1  ? Living  ?2  ?  ? ? SAB  ?1  ? IAB  ?   ? Ectopic  ?   ? Multiple  ?   ? Live Births  ?   ?   ?  ?  ? ? ? ?Home Medications   ? ?Prior to Admission medications   ?Medication Sig Start Date End Date Taking? Authorizing Provider  ?albuterol (VENTOLIN HFA) 108 (90 Base) MCG/ACT inhaler Inhale 2 puffs into the lungs every 6 (six) hours as needed for wheezing or shortness of breath. 08/09/20   Henson, Vickie L, NP-C  ?azithromycin (ZITHROMAX) 250 MG tablet Take 2 tablets by mouth today then take 1 tablet daily until gone 06/08/21   Shelly Coss, MD  ?Calcium Carbonate (CALCIUM 500 PO) Take by mouth.    [provider]  ?estradiol (CLIMARA) 0.025 mg/24hr patch Place 1 patch (0.025 mg total) onto the skin once a week. 03/20/21    Tamela Gammon, NP  ?Glucos-Chondroit-Hyaluron-MSM (GLUCOSAMINE CHONDROITIN JOINT PO) Take 1 tablet by mouth daily.    [provider]  ?progesterone (PROMETRIUM) 100 MG capsule Take 1 capsule (100 mg total) by mouth daily. 03/20/21   Tamela Gammon, NP  ? ? ?Family History ?Family History  ?Problem Relation Age of Onset  ? Hypertension Father   ? Diabetes Father   ? Hyperlipidemia Father   ? Cervical cancer Sister   ?     Stage IV on chemo  ? Colon cancer Neg Hx   ? Colon polyps Neg Hx   ? Esophageal cancer Neg Hx   ? Rectal cancer Neg Hx   ? Stomach cancer Neg Hx   ? ? ?Social History ?Social History  ? ?Tobacco Use  ? Smoking status: Never  ? Smokeless tobacco: Never  ?Vaping Use  ? Vaping Use: Never used  ?Substance Use Topics  ? Alcohol use: Never  ? Drug use: Never  ? ? ? ?Allergies   ?Patient has no known allergies. ? ? ?Review of Systems ?Review  of Systems ?Pertinent negatives listed in HPI  ? ?Physical Exam ?Triage Vital Signs ?ED Triage Vitals  ?Enc Vitals Group  ?   BP 08/11/21 1910 (!) 148/79  ?   Pulse Rate 08/11/21 1910 80  ?   Resp 08/11/21 1910 18  ?   Temp 08/11/21 1910 98 ?F (36.7 ?C)  ?   Temp Source 08/11/21 1910 Oral  ?   SpO2 08/11/21 1910 98 %  ?   Weight --   ?   Height --   ?   Head Circumference --   ?   Peak Flow --   ?   Pain Score 08/11/21 1908 9  ?   Pain Loc --   ?   Pain Edu? --   ?   Excl. in [place]? --   ? ?No data found. ? ?Updated Vital Signs ?BP (!) 148/79   Pulse 80   Temp 98 ?F (36.7 ?C) (Oral)   Resp 18   SpO2 98%  ? ?Visual Acuity ?Right Eye Distance:   ?Left Eye Distance:   ?Bilateral Distance:   ? ?Right Eye Near:   ?Left Eye Near:    ?Bilateral Near:    ? ?Physical Exam ?Constitutional:   ?   Appearance: Normal appearance.  ?HENT:  ?   Head: Normocephalic.  ?Eyes:  ?   Extraocular Movements: Extraocular movements intact.  ?   Pupils: Pupils are equal, round, and reactive to light.  ?Cardiovascular:  ?   Rate and Rhythm: Normal rate.  ?Pulmonary:  ?    Effort: Pulmonary effort is normal.  ?   Breath sounds: Normal breath sounds.  ?Musculoskeletal:  ?   Cervical back: Normal range of motion.  ?   Right knee: Swelling present. Decreased range of motion. Tenderness present over the patellar tendon.  ?Neurological:  ?   General: No focal deficit present.  ?   Mental Status: She is alert.  ?Psychiatric:     ?   Attention and Perception: Attention normal.     ?   Mood and Affect: Mood normal.     ?   Behavior: Behavior normal.  ? ? ? ?UC Treatments / Results  ?Labs ?(all labs ordered are listed, but only abnormal results are displayed) ?Labs Reviewed - No data to display ? ?EKG ? ? ?Radiology ?DG Knee Complete 4 Views Right ? ?Result Date: 08/11/2021 ?CLINICAL DATA:  Heard a pop when bending leg the step upward. Pain with extension with walking. EXAM: RIGHT KNEE - COMPLETE 4+ VIEW COMPARISON:  None Available. FINDINGS: Minimal medial compartment joint space narrowing and peripheral degenerative spurring. No joint effusion. No acute fracture or dislocation. IMPRESSION: Minimal medial compartment osteoarthritis. Electronically Signed   By: Yvonne Kendall M.D.   On: 08/11/2021 19:59   ? ?Procedures ?Procedures (including critical care time) ? ?Medications Ordered in UC ?Medications - No data to display ? ?Initial Impression / Assessment and Plan / UC Course  ?I have reviewed the triage vital signs and the nursing notes. ? ?Pertinent labs & imaging results that were available during my care of the patient were reviewed by me and considered in my medical decision making (see chart for details). ? ?  ?Imaging negative for any acute findings, however revealed mild osteoarthritis involving the right knee.  Recommended RICE along with OTC anti-inflammatories.  Follow-up with orthopedic if symptoms do not improve. ?Final Clinical Impressions(s) / UC Diagnoses  ? ?Final diagnoses:  ?Osteoarthritis of right knee, unspecified osteoarthritis  type  ? ?Discharge Instructions   ?None ?   ? ?ED Prescriptions   ?None ?  ? ?PDMP not reviewed this encounter. ?  ?Scot Jun, FNP ?08/11/21 2049 ? ?  ?Scot Jun, FNP ?08/11/21 2052 ? ?

## 2021-08-11 NOTE — ED Triage Notes (Signed)
Pt is present today with right leg pain. Pt states that the pain started x2 weeks ago. Pt has visible swelling in her right knee.  ?

## 2021-08-31 ENCOUNTER — Other Ambulatory Visit (HOSPITAL_BASED_OUTPATIENT_CLINIC_OR_DEPARTMENT_OTHER): Payer: Self-pay

## 2021-09-05 ENCOUNTER — Other Ambulatory Visit (HOSPITAL_BASED_OUTPATIENT_CLINIC_OR_DEPARTMENT_OTHER): Payer: Self-pay

## 2021-10-12 ENCOUNTER — Telehealth: Payer: Self-pay

## 2021-10-12 ENCOUNTER — Other Ambulatory Visit (HOSPITAL_BASED_OUTPATIENT_CLINIC_OR_DEPARTMENT_OTHER): Payer: Self-pay

## 2021-10-12 DIAGNOSIS — Z79890 Hormone replacement therapy (postmenopausal): Secondary | ICD-10-CM

## 2021-10-12 MED ORDER — ESTRADIOL 0.025 MG/24HR TD PTWK
0.0250 mg | MEDICATED_PATCH | TRANSDERMAL | 0 refills | Status: DC
  Filled 2021-10-12: qty 24, 168d supply, fill #0
  Filled 2021-10-20: qty 12, 84d supply, fill #0

## 2021-10-12 MED ORDER — PROGESTERONE MICRONIZED 100 MG PO CAPS
100.0000 mg | ORAL_CAPSULE | Freq: Every day | ORAL | 0 refills | Status: DC
  Filled 2021-10-12: qty 180, 180d supply, fill #0
  Filled 2021-10-20: qty 90, 90d supply, fill #0

## 2021-10-12 NOTE — Telephone Encounter (Signed)
TW pt. Spouse calling to report him and pt are going to be travelling to [place] and are going to be there for about 6 months and are requesting a 83month fill of her HRTs (patch/progesterone).  Last AEX 02/08/21 Last mammo 12/28/20-birads 0, Rt Diag 01/18/21-birads 2  Please advise.

## 2021-10-12 NOTE — Telephone Encounter (Signed)
Confirmed pharmacy w/ pt. Pt notified. Rx sent.

## 2021-10-12 NOTE — Telephone Encounter (Signed)
Ok to send with note to pharmacy stating reason why

## 2021-10-20 ENCOUNTER — Other Ambulatory Visit (HOSPITAL_BASED_OUTPATIENT_CLINIC_OR_DEPARTMENT_OTHER): Payer: Self-pay

## 2022-02-21 ENCOUNTER — Encounter: Payer: Self-pay | Admitting: Internal Medicine

## 2022-04-13 ENCOUNTER — Other Ambulatory Visit (HOSPITAL_BASED_OUTPATIENT_CLINIC_OR_DEPARTMENT_OTHER): Payer: Self-pay

## 2022-04-13 ENCOUNTER — Encounter: Payer: Self-pay | Admitting: Nurse Practitioner

## 2022-04-13 ENCOUNTER — Ambulatory Visit (INDEPENDENT_AMBULATORY_CARE_PROVIDER_SITE_OTHER): Payer: 59 | Admitting: Nurse Practitioner

## 2022-04-13 VITALS — BP 124/72 | HR 64 | Ht 63.0 in | Wt 124.0 lb

## 2022-04-13 DIAGNOSIS — J4520 Mild intermittent asthma, uncomplicated: Secondary | ICD-10-CM | POA: Diagnosis not present

## 2022-04-13 DIAGNOSIS — Z01419 Encounter for gynecological examination (general) (routine) without abnormal findings: Secondary | ICD-10-CM

## 2022-04-13 DIAGNOSIS — Z79890 Hormone replacement therapy (postmenopausal): Secondary | ICD-10-CM | POA: Diagnosis not present

## 2022-04-13 DIAGNOSIS — N950 Postmenopausal bleeding: Secondary | ICD-10-CM

## 2022-04-13 MED ORDER — ALBUTEROL SULFATE HFA 108 (90 BASE) MCG/ACT IN AERS
2.0000 | INHALATION_SPRAY | Freq: Four times a day (QID) | RESPIRATORY_TRACT | 0 refills | Status: DC | PRN
  Filled 2022-04-13: qty 6.7, 25d supply, fill #0

## 2022-04-13 NOTE — Progress Notes (Signed)
Jill Barry [birth year] [id number]   History:  62 y.o. G2P0012 presents for annual exam. Reports ~8 episodes of vaginal spotting this past year. Last episode around September 2023. Bleeding was light, lasted a few days, and was not related to intercourse. No late or missed doses of HRT. On HRT since 2013 for management of joint pain, mood changes, and hot flashes. Normal pap and mammogram history. PCP left practice, so she needs refill on Albuterol inhaler until she finds new PCP. Husband present during visit.   Gynecologic History No LMP recorded. Patient is postmenopausal.   Contraception/Family planning: post menopausal status Sexually active: Yes  Health maintenance Last Pap: 12/09/2019. Results were: Normal, 5-year repeat Last mammogram: 12/28/2020. Results were: Right breast asymmetry, F/U ultrasound 01/18/2021 normal lymph node with calcification (stable since 2017), no evidence of malignancy Last colonoscopy: ~ 4 years ago. Results were: Normal per patient Last Dexa: 02/22/2021. Results were: Normal  Past medical history, past surgical history, family history and social history were all reviewed and documented in the EPIC chart. Married. 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 Husband is hospitalist. 2 sons, one MD and other in med school for GI. Sister diagnosed with cervical cancer stage IV.   ROS:  A ROS was performed and pertinent positives and negatives are included.  Exam:  Vitals:   04/13/22 1116  BP: 124/72  Pulse: 64  SpO2: 99%  Weight: 124 lb (56.2 kg)  Height: 5\' 3"  (1.6 m)     Body mass index is 21.97 kg/m.  General appearance:  Normal Thyroid:  Symmetrical, normal in size, without palpable masses or nodularity. Respiratory  Auscultation:  Clear without wheezing or rhonchi Cardiovascular  Auscultation:  Regular rate, without rubs, murmurs or gallops. Occasional PVCs  Edema/varicosities:  Not grossly evident Abdominal  Soft,nontender, without masses, guarding or  rebound.  Liver/spleen:  No organomegaly noted  Hernia:  None appreciated  Skin  Inspection:  Grossly normal   Breasts: Examined lying and sitting.   Right: Without masses, retractions, discharge or axillary adenopathy.   Left: Without masses, retractions, discharge or axillary adenopathy. Genitourinary   Inguinal/mons:  Normal without inguinal adenopathy  External genitalia:  Normal appearing vulva with no masses, tenderness, or lesions  BUS/Urethra/Skene's glands:  Normal  Vagina:  Normal appearing with normal color and discharge, no lesions  Cervix:  Normal appearing without discharge or lesions  Uterus:  Normal in size, shape and contour.  Midline and mobile, nontender  Adnexa/parametria:     Rt: Normal in size, without masses or tenderness.   Lt: Normal in size, without masses or tenderness.  Anus and perineum: Normal  Patient informed chaperone available to be present for breast and pelvic exam. Patient has requested no chaperone to be present. Patient has been advised what will be completed during breast and pelvic exam.    Assessment/Plan:  62 y.o. [id number] as new patient.    Well female exam with routine gynecological exam - Education provided on SBEs, importance of preventative screenings, current guidelines, high calcium diet, regular exercise, and multivitamin daily. Actively looking for PCP.   Postmenopausal hormone therapy - on HRT. Will stop while PMB is investigated.   Postmenopausal bleeding - Plan: US PELVIS TRANSVAGINAL NON-OB (TV ONLY). Reports ~8 episodes of vaginal spotting this past year. Last episode around September 2023. Bleeding was light, lasted a few days, and was not related to intercourse. No late or missed doses of HRT. Will discontinue HRT until PMB investigated.   Mild intermittent asthma, unspecified  whether complicated - Plan: albuterol (VENTOLIN HFA) 108 (90 Base) MCG/ACT inhaler  Screening for cervical cancer - Normal Pap history.  Will repeat  at 5-year interval per guidelines.  Screening for breast cancer - Normal mammogram history. Overdue and encouraged to schedule. Continue annual screenings.  Normal breast exam today.  Screening for colon cancer - Colonoscopy a few years ago.  Will repeat at GI's recommended interval.   Screening for osteoporosis - Normal bone density in 2022. Will repeat at 5-year interval per recommendation.   Follow up in 1 year for annual.     Tamela Gammon [facility], 11:35 AM 04/13/2022

## 2022-04-16 ENCOUNTER — Other Ambulatory Visit: Payer: Self-pay | Admitting: Nurse Practitioner

## 2022-04-16 ENCOUNTER — Encounter: Payer: Self-pay | Admitting: Nurse Practitioner

## 2022-04-16 ENCOUNTER — Other Ambulatory Visit (HOSPITAL_BASED_OUTPATIENT_CLINIC_OR_DEPARTMENT_OTHER): Payer: Self-pay

## 2022-04-16 DIAGNOSIS — Z79890 Hormone replacement therapy (postmenopausal): Secondary | ICD-10-CM

## 2022-04-16 MED ORDER — ESTRADIOL 0.025 MG/24HR TD PTWK
0.0250 mg | MEDICATED_PATCH | TRANSDERMAL | 0 refills | Status: DC
  Filled 2022-04-16: qty 4, 28d supply, fill #0

## 2022-04-16 MED ORDER — PROGESTERONE MICRONIZED 100 MG PO CAPS
100.0000 mg | ORAL_CAPSULE | Freq: Every day | ORAL | 0 refills | Status: DC
  Filled 2022-04-16: qty 30, 30d supply, fill #0

## 2022-05-24 ENCOUNTER — Ambulatory Visit (INDEPENDENT_AMBULATORY_CARE_PROVIDER_SITE_OTHER): Payer: 59 | Admitting: Nurse Practitioner

## 2022-05-24 ENCOUNTER — Ambulatory Visit (INDEPENDENT_AMBULATORY_CARE_PROVIDER_SITE_OTHER): Payer: 59

## 2022-05-24 ENCOUNTER — Encounter: Payer: Self-pay | Admitting: Nurse Practitioner

## 2022-05-24 ENCOUNTER — Other Ambulatory Visit (HOSPITAL_BASED_OUTPATIENT_CLINIC_OR_DEPARTMENT_OTHER): Payer: Self-pay

## 2022-05-24 VITALS — BP 122/72 | HR 63

## 2022-05-24 DIAGNOSIS — N950 Postmenopausal bleeding: Secondary | ICD-10-CM

## 2022-05-24 DIAGNOSIS — Z79890 Hormone replacement therapy (postmenopausal): Secondary | ICD-10-CM

## 2022-05-24 MED ORDER — PROGESTERONE MICRONIZED 100 MG PO CAPS
100.0000 mg | ORAL_CAPSULE | Freq: Every day | ORAL | 3 refills | Status: DC
  Filled 2022-05-24: qty 90, 90d supply, fill #0
  Filled 2022-08-21: qty 90, 90d supply, fill #1
  Filled 2022-11-21: qty 90, 90d supply, fill #2
  Filled 2023-02-09: qty 90, 90d supply, fill #3

## 2022-05-24 MED ORDER — ESTRADIOL 0.025 MG/24HR TD PTWK
0.0250 mg | MEDICATED_PATCH | TRANSDERMAL | 3 refills | Status: DC
  Filled 2022-05-24: qty 12, 84d supply, fill #0
  Filled 2022-08-21: qty 12, 84d supply, fill #1
  Filled 2022-11-21: qty 12, 84d supply, fill #2
  Filled 2023-02-09: qty 12, 84d supply, fill #3
  Filled 2023-05-10 – 2023-05-13 (×2): qty 12, 84d supply, fill #4

## 2022-05-24 NOTE — Progress Notes (Signed)
   Acute Office Visit  Subjective:    Patient ID: Jill Barry, female    DOB: Apr 17, 1960, 62 y.o.   MRN: [id number]   HPI 62 y.o. [id number] presents today for ultrasound. When seen for annual exam 04/13/2022 she reported ~8 episodes of vaginal spotting this past year. Last episode was around September 2023. Bleeding was light, lasted a few days, and not related to intercourse. No late or missed doses of HRT. On HRT since 2013 for management of joint pain, mood changes, and hot flashes. Recommended stopping HRT until after ultrasound but patient and husband requested to continue. Husband present during visit.    Review of Systems  Constitutional: Negative.   Genitourinary: Negative.        Objective:    Physical Exam Constitutional:      Appearance: Normal appearance.   GU: Not indicated  BP 122/72   Pulse 63   SpO2 99%  Wt Readings from Last 3 Encounters:  04/13/22 124 lb (56.2 kg)  03/28/21 127 lb 1.6 oz (57.7 kg)  02/08/21 120 lb (54.4 kg)        Assessment & Plan:   Problem List Items Addressed This Visit   None Visit Diagnoses     Postmenopausal bleeding    -  Primary   Postmenopausal hormone therapy       Relevant Medications   estradiol (CLIMARA) 0.025 mg/24hr patch   progesterone (PROMETRIUM) 100 MG capsule      Vaginal ultrasound: Anteverted uterus, normal size and shape, no myometrial masses. Thin, symmetrical endometrium - 3.62 mm, no masses or thickening seen, avascular. Bilateral ovaries WNL. No adnexal masses, no free fluid.   Plan: Discussed normal ultrasound findings with patient and husband. May continue HRT as requested. If bleeding returns I recommend increasing Prometrium to 200 mg. All questions answered. Refills provided.      Tamela Gammon DNP, 10:57 AM 05/24/2022

## 2022-06-18 ENCOUNTER — Other Ambulatory Visit (HOSPITAL_BASED_OUTPATIENT_CLINIC_OR_DEPARTMENT_OTHER): Payer: Self-pay

## 2022-06-18 MED ORDER — FLUTICASONE-SALMETEROL 100-50 MCG/ACT IN AEPB
1.0000 | INHALATION_SPRAY | Freq: Two times a day (BID) | RESPIRATORY_TRACT | 0 refills | Status: DC
  Filled 2022-06-18: qty 60, 30d supply, fill #0

## 2022-08-03 ENCOUNTER — Other Ambulatory Visit: Payer: Self-pay | Admitting: Nurse Practitioner

## 2022-08-03 DIAGNOSIS — Z1231 Encounter for screening mammogram for malignant neoplasm of breast: Secondary | ICD-10-CM

## 2022-08-07 ENCOUNTER — Other Ambulatory Visit: Payer: Self-pay

## 2022-08-07 ENCOUNTER — Telehealth: Payer: Self-pay | Admitting: Gastroenterology

## 2022-08-07 DIAGNOSIS — D7389 Other diseases of spleen: Secondary | ICD-10-CM

## 2022-08-07 NOTE — Telephone Encounter (Signed)
Patient's Husband called stating that the patient is due for a MRI every 6 months, but has not had one since last year and has not heard anything to schedule another one that she is due for. He is requesting a call back to get this scheduled. Please advise, thank you.

## 2022-08-08 NOTE — Telephone Encounter (Signed)
MRI scheduled.

## 2022-08-15 ENCOUNTER — Ambulatory Visit
Admission: RE | Admit: 2022-08-15 | Discharge: 2022-08-15 | Disposition: A | Discharge status: Home / Self Care | Payer: 59 | Source: Ambulatory Visit

## 2022-08-15 DIAGNOSIS — Z1231 Encounter for screening mammogram for malignant neoplasm of breast: Secondary | ICD-10-CM | POA: Diagnosis not present

## 2022-08-21 ENCOUNTER — Other Ambulatory Visit (HOSPITAL_BASED_OUTPATIENT_CLINIC_OR_DEPARTMENT_OTHER): Payer: Self-pay

## 2022-08-21 ENCOUNTER — Ambulatory Visit (HOSPITAL_COMMUNITY)
Admission: RE | Admit: 2022-08-21 | Discharge: 2022-08-21 | Disposition: A | Discharge status: Home / Self Care | Payer: 59 | Source: Ambulatory Visit | Attending: Gastroenterology | Admitting: Gastroenterology

## 2022-08-21 DIAGNOSIS — D7389 Other diseases of spleen: Secondary | ICD-10-CM

## 2022-08-21 MED ORDER — GADOBUTROL 1 MMOL/ML IV SOLN
5.0000 mL | Freq: Once | INTRAVENOUS | Status: AC | PRN
  Administered 2022-08-21: 5 mL via INTRAVENOUS

## 2022-08-27 ENCOUNTER — Other Ambulatory Visit (HOSPITAL_BASED_OUTPATIENT_CLINIC_OR_DEPARTMENT_OTHER): Payer: Self-pay

## 2022-08-30 ENCOUNTER — Other Ambulatory Visit (HOSPITAL_BASED_OUTPATIENT_CLINIC_OR_DEPARTMENT_OTHER): Payer: Self-pay

## 2022-10-29 ENCOUNTER — Encounter: Payer: Self-pay | Admitting: Gastroenterology

## 2022-11-21 ENCOUNTER — Other Ambulatory Visit (HOSPITAL_BASED_OUTPATIENT_CLINIC_OR_DEPARTMENT_OTHER): Payer: Self-pay

## 2022-11-22 ENCOUNTER — Other Ambulatory Visit (HOSPITAL_BASED_OUTPATIENT_CLINIC_OR_DEPARTMENT_OTHER): Payer: Self-pay

## 2022-12-25 ENCOUNTER — Ambulatory Visit: Payer: 59 | Admitting: Medical

## 2023-01-01 ENCOUNTER — Other Ambulatory Visit (HOSPITAL_BASED_OUTPATIENT_CLINIC_OR_DEPARTMENT_OTHER): Payer: Self-pay

## 2023-01-01 ENCOUNTER — Ambulatory Visit (AMBULATORY_SURGERY_CENTER): Payer: 59

## 2023-01-01 VITALS — Ht 63.0 in | Wt 120.0 lb

## 2023-01-01 DIAGNOSIS — Z1211 Encounter for screening for malignant neoplasm of colon: Secondary | ICD-10-CM

## 2023-01-01 MED ORDER — NA SULFATE-K SULFATE-MG SULF 17.5-3.13-1.6 GM/177ML PO SOLN
1.0000 | Freq: Once | ORAL | 0 refills | Status: AC
  Filled 2023-01-01: qty 354, 2d supply, fill #0

## 2023-01-01 NOTE — Progress Notes (Signed)

## 2023-01-04 ENCOUNTER — Other Ambulatory Visit (HOSPITAL_BASED_OUTPATIENT_CLINIC_OR_DEPARTMENT_OTHER): Payer: Self-pay

## 2023-01-10 ENCOUNTER — Encounter: Payer: Self-pay | Admitting: Gastroenterology

## 2023-01-15 ENCOUNTER — Encounter: Payer: Self-pay | Admitting: Certified Registered Nurse Anesthetist

## 2023-01-16 ENCOUNTER — Other Ambulatory Visit (HOSPITAL_BASED_OUTPATIENT_CLINIC_OR_DEPARTMENT_OTHER): Payer: Self-pay

## 2023-01-16 ENCOUNTER — Ambulatory Visit (INDEPENDENT_AMBULATORY_CARE_PROVIDER_SITE_OTHER): Payer: 59 | Admitting: Medical

## 2023-01-16 VITALS — BP 130/72 | HR 86 | Ht 63.0 in | Wt 123.4 lb

## 2023-01-16 DIAGNOSIS — Z13220 Encounter for screening for lipoid disorders: Secondary | ICD-10-CM

## 2023-01-16 DIAGNOSIS — Z1329 Encounter for screening for other suspected endocrine disorder: Secondary | ICD-10-CM | POA: Diagnosis not present

## 2023-01-16 DIAGNOSIS — Z0000 Encounter for general adult medical examination without abnormal findings: Secondary | ICD-10-CM | POA: Diagnosis not present

## 2023-01-16 DIAGNOSIS — J4520 Mild intermittent asthma, uncomplicated: Secondary | ICD-10-CM | POA: Diagnosis not present

## 2023-01-16 MED ORDER — ALBUTEROL SULFATE HFA 108 (90 BASE) MCG/ACT IN AERS
2.0000 | INHALATION_SPRAY | Freq: Four times a day (QID) | RESPIRATORY_TRACT | 3 refills | Status: AC | PRN
  Filled 2023-01-16: qty 6.7, 25d supply, fill #0
  Filled 2023-03-01: qty 6.7, 25d supply, fill #1

## 2023-01-16 NOTE — Progress Notes (Signed)
Subjective:   HPI  Jill Barry is a 62 y.o. female who presents for Chief Complaint  Patient presents with   Annual Exam    Cpe, labs done this morning. No concerns, declines flu and covid, needs refill on inhaler. Due or tdap but not sure she wants it, has colonoscopy scheduled for next week    Patient Care Team: Yarielys Beed, Kermit Balo, PA-C as PCP - General (Family Medicine) Meriam Sprague, MD as PCP - Cardiology (Cardiology) Olivia Mackie, NP as Nurse Practitioner (Gynecology)   Concerns: Asthma - uses albuterol 1-2 days per week.  Has had to use Advair in past, but no recent problems.     Reviewed their medical, surgical, family, social, medication, and allergy history and updated chart as appropriate.  No Known Allergies  Past Medical History:  Diagnosis Date   Arthritis    Asthma    Heart murmur    Irregular heart beat    PVC's (premature ventricular contractions)     Current Outpatient Medications on File Prior to Visit  Medication Sig Dispense Refill   Calcium Carbonate (CALCIUM 500 PO) Take by mouth.     estradiol (CLIMARA) 0.025 mg/24hr patch Place 1 patch (0.025 mg total) onto the skin once a week. 24 patch 3   Glucos-Chondroit-Hyaluron-MSM (GLUCOSAMINE CHONDROITIN JOINT PO) Take 1 tablet by mouth daily.     progesterone (PROMETRIUM) 100 MG capsule Take 1 capsule (100 mg total) by mouth daily. 90 capsule 3   No current facility-administered medications on file prior to visit.      Current Outpatient Medications:    Calcium Carbonate (CALCIUM 500 PO), Take by mouth., Disp: , Rfl:    estradiol (CLIMARA) 0.025 mg/24hr patch, Place 1 patch (0.025 mg total) onto the skin once a week., Disp: 24 patch, Rfl: 3   Glucos-Chondroit-Hyaluron-MSM (GLUCOSAMINE CHONDROITIN JOINT PO), Take 1 tablet by mouth daily., Disp: , Rfl:    progesterone (PROMETRIUM) 100 MG capsule, Take 1 capsule (100 mg total) by mouth daily., Disp: 90 capsule, Rfl: 3   albuterol (VENTOLIN  HFA) 108 (90 Base) MCG/ACT inhaler, Inhale 2 puffs into the lungs every 6 (six) hours as needed for wheezing or shortness of breath., Disp: 8 g, Rfl: 3  Family History  Problem Relation Age of Onset   Hypertension Father    Diabetes Father    Hyperlipidemia Father    Cervical cancer Sister        Stage IV on chemo   Colon cancer Neg Hx    Colon polyps Neg Hx    Esophageal cancer Neg Hx    Rectal cancer Neg Hx    Stomach cancer Neg Hx     Past Surgical History:  Procedure Laterality Date   NOSE SURGERY  2017   UPPER GASTROINTESTINAL ENDOSCOPY  10/01/2019    Review of Systems  Constitutional:  Negative for chills, fever, malaise/fatigue and weight loss.  HENT:  Negative for congestion, ear pain, hearing loss, sore throat and tinnitus.   Eyes:  Negative for blurred vision, pain and redness.  Respiratory:  Negative for cough, hemoptysis and shortness of breath.   Cardiovascular:  Negative for chest pain, palpitations, orthopnea, claudication and leg swelling.  Gastrointestinal:  Negative for abdominal pain, blood in stool, constipation, diarrhea, nausea and vomiting.  Genitourinary:  Negative for dysuria, flank pain, frequency, hematuria and urgency.  Musculoskeletal:  Negative for falls, joint pain and myalgias.  Skin:  Negative for itching and rash.  Neurological:  Negative for dizziness,  tingling, speech change, weakness and headaches.  Endo/Heme/Allergies:  Negative for polydipsia. Does not bruise/bleed easily.  Psychiatric/Behavioral:  Negative for depression and memory loss. The patient is not nervous/anxious and does not have insomnia.          Objective:  BP 130/72   Pulse 86   Ht 5\' 3"  (1.6 m)   Wt 123 lb 6.4 oz (56 kg)   SpO2 99%   BMI 21.86 kg/m   General appearance: alert, no distress, WD/WN, Asian female Skin: unremarkable HEENT: normocephalic, conjunctiva/corneas normal, sclerae anicteric, PERRLA, EOMi, nares patent, no discharge or erythema, pharynx  normal Oral cavity: MMM, tongue normal, teeth in good repair Neck: supple, no lymphadenopathy, no thyromegaly, no masses, normal ROM, no bruits Chest: non tender, normal shape and expansion Heart: RRR, normal S1, S2, no murmurs Lungs: CTA bilaterally, no wheezes, rhonchi, or rales Abdomen: +bs, soft, non tender, non distended, no masses, no hepatomegaly, no splenomegaly, no bruits Back: non tender, normal ROM, no scoliosis Musculoskeletal: upper extremities non tender, no obvious deformity, normal ROM throughout, lower extremities non tender, no obvious deformity, normal ROM throughout Extremities: no edema, no cyanosis, no clubbing Pulses: 2+ symmetric, upper and lower extremities, normal cap refill Neurological: alert, oriented x 3, CN2-12 intact, strength normal upper extremities and lower extremities, sensation normal throughout, DTRs 2+ throughout, no cerebellar signs, gait normal Psychiatric: normal affect, behavior normal, pleasant  Breast/gyn/rectal - deferred to gynecology     Assessment and Plan :   Encounter Diagnoses  Name Primary?   Encounter for health maintenance examination in adult Yes   Screening for lipid disorders    Screening for thyroid disorder    Mild intermittent asthma, unspecified whether complicated     This visit was a preventative care visit, also known as wellness visit or routine physical.   Topics typically include healthy lifestyle, diet, exercise, preventative care, vaccinations, sick and well care, proper use of emergency dept and after hours care, as well as other concerns.     General Recommendations: Continue to return yearly for your annual wellness and preventative care visits.  This gives [place] a chance to discuss healthy lifestyle, exercise, vaccinations, review your chart record, and perform screenings where appropriate.  I recommend you see your eye doctor yearly for routine vision care.  I recommend you see your dentist yearly for routine  dental care including hygiene visits twice yearly.   Vaccination recommendations were reviewed Immunization History  Administered Date(s) Administered   Zoster Recombinant(Shingrix) 08/22/2020, 01/03/2021   Declines vaccine today  Screening for cancer: Colon cancer screening: Pending next week with Dr. Lavon Paganini  Breast cancer screening: You should perform a self breast exam monthly.   We reviewed recommendations for regular mammograms and breast cancer screening. Last mammogram: 08/2022 reviewed, normal  Cervical cancer screening: We reviewed recommendations for pap smear screening. Last pap 2021 normal but she notes more recent pap through gyn   Skin cancer screening: Check your skin regularly for new changes, growing lesions, or other lesions of concern Come in for evaluation if you have skin lesions of concern.  Lung cancer screening: If you have a greater than 20 pack year history of tobacco use, then you may qualify for lung cancer screening with a chest CT scan.   Please call your insurance company to inquire about coverage for this test.  Pancreatic cancer: no current screening test is available routinely recommended.  (Risk factors: Smoking, overweight or obese, diabetes, chronic pancreatitis, work Nurse, mental health, Solicitor, 13 year old or  greater, female greater than female, African-American, family history of pancreatic cancer, hereditary breast, ovarian, melanoma, Lynch, Peutz-jeghers).  We currently don't have screenings for other cancers besides breast, cervical, colon, and lung cancers.  If you have a strong family history of cancer or have other cancer screening concerns, please let me know.    Bone health: Get at least 150 minutes of aerobic exercise weekly Get weight bearing exercise at least once weekly Bone density test:  A bone density test is an imaging test that uses a type of X-ray to measure the amount of calcium and other minerals in your  bones. The test may be used to diagnose or screen you for a condition that causes weak or thin bones (osteoporosis), predict your risk for a broken bone (fracture), or determine how well your osteoporosis treatment is working. The bone density test is recommended for females 65 and older, or females or males <65 if certain risk factors such as thyroid disease, long term use of steroids such as for asthma or rheumatological issues, vitamin D deficiency, estrogen deficiency, family history of osteoporosis, self or family history of fragility fracture in first degree relative.    Heart health: Get at least 150 minutes of aerobic exercise weekly Limit alcohol It is important to maintain a healthy blood pressure and healthy cholesterol numbers  Heart disease screening: Screening for heart disease includes screening for blood pressure, fasting lipids, glucose/diabetes screening, BMI height to weight ratio, reviewed of smoking status, physical activity, and diet.    Goals include blood pressure 120/80 or less, maintaining a healthy lipid/cholesterol profile, preventing diabetes or keeping diabetes numbers under good control, not smoking or using tobacco products, exercising most days per week or at least 150 minutes per week of exercise, and eating healthy variety of fruits and vegetables, healthy oils, and avoiding unhealthy food choices like fried food, fast food, high sugar and high cholesterol foods.    Other tests may possibly include EKG test, CT coronary calcium score, echocardiogram, exercise treadmill stress test.    CT coronary calcium score zero 03/2021   Vascular disease screening: For high risk individuals including smokers, diabetes, patients with known heart disease or high blood pressure, kidney disease, and others, screening for vascular disease or atherosclerosis of the arteries is available.  Examples may include carotid ultrasound, abdominal aortic ultrasound, ABI blood flow  screening in the legs, thoracic aorta screening.    Medical care options: I recommend you continue to seek care here first for routine care.  We try really hard to have available appointments Monday through Friday daytime hours for sick visits, acute visits, and physicals.  Urgent care should be used for after hours and weekends for significant issues that cannot wait till the next day.  The emergency department should be used for significant potentially life-threatening emergencies.  The emergency department is expensive, can often have long wait times for less significant concerns, so try to utilize primary care, urgent care, or telemedicine when possible to avoid unnecessary trips to the emergency department.  Virtual visits and telemedicine have been introduced since the pandemic started in 2020, and can be convenient ways to receive medical care.  We offer virtual appointments as well to assist you in a variety of options to seek medical care.   Legal Take the time to do a Last Will and Testament, advanced directives including Healthcare Power of Attorney and Living Will documents.  Do not leave your family with burdens that can be handled ahead of  time.   Advanced Directives: I recommend you consider completing a Health Care Power of Attorney and Living Will.   These documents respect your wishes and help alleviate burdens on your loved ones if you were to become terminally ill or be in a position to need those documents enforced.    You can complete Advanced Directives yourself, have them notarized, then have copies made for our office, for you and for anybody you feel should have them in safe keeping.  Or, you can have an attorney prepare these documents.   If you haven't updated your Last Will and Testament in a while, it may be worthwhile having an attorney prepare these documents together and save on some costs.       Spiritual and Emotional Health Keeping a healthy spiritual life  can help you better manage your physical health. Your spiritual life can help you to cope with any issues that may arise with your physical health.  Balance can keep [place] healthy and help [place] to recover.  If you are struggling with your spiritual health there are questions that you may want to ask yourself:  What makes me feel most complete? When do I feel most connected to the rest of the world? Where do I find the most inner strength? What am I doing when I feel whole?  Helpful tips: Being in nature. Some people feel very connected and at peace when they are walking outdoors or are outside. Helping others. Some feel the largest sense of wellbeing when they are of service to others. Being of service can take on many forms. It can be doing volunteer work, being kind to strangers, or offering a hand to a friend in need. Gratitude. Some people find they feel the most connected when they remain grateful. They may make lists of all the things they are grateful for or say a thank you out loud for all they have.    Emotional Health Are you in tune with your emotional health?  Check out this link: http://www.marquez-love.com/   Financial Health Make sure you use a budget for your personal finances Make sure you are insured against risks (health insurance, life insurance, auto insurance, etc) Save more, spend less Set financial goals If you need help in this area, good resources include counseling through [employer] or other community resources, have a meeting with a Social research officer, government, and a good resource is [employer] was seen today for annual exam.  Diagnoses and all orders for this visit:  Encounter for health maintenance examination in adult -     Comprehensive metabolic panel -     CBC -     Lipid panel -     TSH -     VITAMIN D 25 Hydroxy (Vit-D Deficiency, Fractures)  Screening for lipid disorders -     Lipid panel  Screening for thyroid  disorder -     TSH  Mild intermittent asthma, unspecified whether complicated -     albuterol (VENTOLIN HFA) 108 (90 Base) MCG/ACT inhaler; Inhale 2 puffs into the lungs every 6 (six) hours as needed for wheezing or shortness of breath.   Follow-up pending labs, yearly for physical

## 2023-01-17 LAB — LIPID PANEL
Cholesterol, Total: 199 mg/dL (ref 100–199)
HDL: 59 mg/dL (ref >39)
LDL CALC COMMENT:: 3.4 ratio (ref 0.0–4.4)
LDL Chol Calc (NIH): 120 mg/dL — ABNORMAL HIGH (ref 0–99)
Triglycerides: 110 mg/dL (ref 0–149)
VLDL Cholesterol Cal: 20 mg/dL (ref 5–40)

## 2023-01-17 LAB — CBC
Hematocrit: 43.8 % (ref 34.0–46.6)
Hemoglobin: 14.4 g/dL (ref 11.1–15.9)
MCH: 33.1 pg — ABNORMAL HIGH (ref 26.6–33.0)
MCHC: 32.9 g/dL (ref 31.5–35.7)
MCV: 101 fL — ABNORMAL HIGH (ref 79–97)
Platelets: 217 10*3/uL (ref 150–450)
RBC: 4.35 x10E6/uL (ref 3.77–5.28)
RDW: 11.8 % (ref 11.7–15.4)
WBC: 7.2 10*3/uL (ref 3.4–10.8)

## 2023-01-17 LAB — COMPREHENSIVE METABOLIC PANEL
ALT: 19 IU/L (ref 0–32)
AST: 21 IU/L (ref 0–40)
Albumin: 4.3 g/dL (ref 3.9–4.9)
Alkaline Phosphatase: 93 IU/L (ref 44–121)
BUN/Creatinine Ratio: 15 (ref 12–28)
BUN: 11 mg/dL (ref 8–27)
Bilirubin Total: 0.5 mg/dL (ref 0.0–1.2)
CO2: 24 mmol/L (ref 20–29)
Calcium: 9.8 mg/dL (ref 8.7–10.3)
Chloride: 101 mmol/L (ref 96–106)
Creatinine, Ser: 0.74 mg/dL (ref 0.57–1.00)
Globulin, Total: 2.3 g/dL (ref 1.5–4.5)
Glucose: 95 mg/dL (ref 70–99)
Potassium: 4.3 mmol/L (ref 3.5–5.2)
Sodium: 142 mmol/L (ref 134–144)
Total Protein: 6.6 g/dL (ref 6.0–8.5)
eGFR: 91 mL/min/{1.73_m2} (ref >59)

## 2023-01-17 LAB — TSH: TSH: 1.74 u[IU]/mL (ref 0.450–4.500)

## 2023-01-17 LAB — VITAMIN D 25 HYDROXY (VIT D DEFICIENCY, FRACTURES): Vit D, 25-Hydroxy: 43.8 ng/mL (ref 30.0–100.0)

## 2023-01-17 NOTE — Progress Notes (Signed)
Results sent through MyChart

## 2023-01-22 ENCOUNTER — Ambulatory Visit (AMBULATORY_SURGERY_CENTER): Payer: 59 | Admitting: Gastroenterology

## 2023-01-22 ENCOUNTER — Encounter: Payer: Self-pay | Admitting: Gastroenterology

## 2023-01-22 VITALS — BP 114/77 | HR 55 | Temp 96.9°F | Resp 21 | Ht 63.0 in | Wt 120.0 lb

## 2023-01-22 DIAGNOSIS — I493 Ventricular premature depolarization: Secondary | ICD-10-CM | POA: Diagnosis not present

## 2023-01-22 DIAGNOSIS — Z1211 Encounter for screening for malignant neoplasm of colon: Secondary | ICD-10-CM

## 2023-01-22 DIAGNOSIS — J45909 Unspecified asthma, uncomplicated: Secondary | ICD-10-CM | POA: Diagnosis not present

## 2023-01-22 MED ORDER — SODIUM CHLORIDE 0.9 % IV SOLN: 500.0000 mL | Freq: Once | INTRAVENOUS | Status: DC

## 2023-01-22 NOTE — Op Note (Signed)
[facility] Patient Name: Jill Barry Procedure Date: 01/22/2023 7:18 AM MRN: [id number] Endoscopist: Napoleon Form , MD, [id number] Age: 62 Referring MD:  Date of Birth: 1960-12-20 Gender: Female Account #: [id number] Procedure:                Colonoscopy Indications:              Screening for colorectal malignant neoplasm Medicines:                Monitored Anesthesia Care Procedure:                Pre-Anesthesia Assessment:                           - Prior to the procedure, a History and Physical                            was performed, and patient medications and                            allergies were reviewed. The patient's tolerance of                            previous anesthesia was also reviewed. The risks                            and benefits of the procedure and the sedation                            options and risks were discussed with the patient.                            All questions were answered, and informed consent                            was obtained. Prior Anticoagulants: The patient has                            taken no anticoagulant or antiplatelet agents. ASA                            Grade Assessment: II - A patient with mild systemic                            disease. After reviewing the risks and benefits,                            the patient was deemed in satisfactory condition to                            undergo the procedure.                           After obtaining informed consent, the colonoscope  was passed under direct vision. Throughout the                            procedure, the patient's blood pressure, pulse, and                            oxygen saturations were monitored continuously. The                            Olympus Scope [id number] was introduced through the                            anus and advanced to the the terminal ileum, with                             identification of the appendiceal orifice and IC                            valve. The colonoscopy was performed without                            difficulty. The patient tolerated the procedure                            well. The quality of the bowel preparation was                            good. The terminal ileum, ileocecal valve,                            appendiceal orifice, and rectum were photographed. Scope In: 8:08:42 AM Scope Out: 8:22:14 AM Scope Withdrawal Time: 0 hours 10 minutes 6 seconds  Total Procedure Duration: 0 hours 13 minutes 32 seconds  Findings:                 The perianal and digital rectal examinations were                            normal.                           Non-bleeding internal hemorrhoids were found during                            retroflexion. The hemorrhoids were medium-sized.                           The exam was otherwise without abnormality. Complications:            No immediate complications. Estimated Blood Loss:     Estimated blood loss: none. Impression:               - Non-bleeding internal hemorrhoids.                           - The examination was otherwise normal.                           -  No specimens collected. Recommendation:           - Resume previous diet.                           - Continue present medications.                           - Repeat colonoscopy in 10 years for surveillance. Napoleon Form, MD 01/22/2023 8:27:32 AM This report has been signed electronically.

## 2023-01-22 NOTE — Patient Instructions (Signed)
YOU HAD AN ENDOSCOPIC PROCEDURE TODAY AT THE [facility]:   Refer to the procedure report that was given to you for any specific questions about what was found during the examination.  If the procedure report does not answer your questions, please call your gastroenterologist to clarify.  If you requested that your care partner not be given the details of your procedure findings, then the procedure report has been included in a sealed envelope for you to review at your convenience later.  YOU SHOULD EXPECT: Some feelings of bloating in the abdomen. Passage of more gas than usual.  Walking can help get rid of the air that was put into your GI tract during the procedure and reduce the bloating. If you had a lower endoscopy (such as a colonoscopy or flexible sigmoidoscopy) you may notice spotting of blood in your stool or on the toilet paper. If you underwent a bowel prep for your procedure, you may not have a normal bowel movement for a few days.  Please Note:  You might notice some irritation and congestion in your nose or some drainage.  This is from the oxygen used during your procedure.  There is no need for concern and it should clear up in a day or so.  SYMPTOMS TO REPORT IMMEDIATELY:  Following lower endoscopy (colonoscopy or flexible sigmoidoscopy):  Excessive amounts of blood in the stool  Significant tenderness or worsening of abdominal pains  Swelling of the abdomen that is new, acute  Fever of 100F or higher  For urgent or emergent issues, a gastroenterologist can be reached at any hour by calling [phone]. Do not use MyChart messaging for urgent concerns.    DIET:  We do recommend a small meal at first, but then you may proceed to your regular diet.  Drink plenty of fluids but you should avoid alcoholic beverages for 24 hours.  ACTIVITY:  You should plan to take it easy for the rest of today and you should NOT DRIVE or use heavy machinery until tomorrow (because of  the sedation medicines used during the test).    FOLLOW UP: Our staff will call the number listed on your records the next business day following your procedure.  We will call around 7:15- 8:00 am to check on you and address any questions or concerns that you may have regarding the information given to you following your procedure. If we do not reach you, we will leave a message.      SIGNATURES/CONFIDENTIALITY: You and/or your care partner have signed paperwork which will be entered into your electronic medical record.  These signatures attest to the fact that that the information above on your After Visit Summary has been reviewed and is understood.  Full responsibility of the confidentiality of this discharge information lies with you and/or your care-partner. 

## 2023-01-22 NOTE — Progress Notes (Signed)
Report given to PACU, vss 

## 2023-01-22 NOTE — Progress Notes (Signed)
Pt's states no medical or surgical changes since previsit or office visit. 

## 2023-01-22 NOTE — Progress Notes (Signed)
[facility] History and Physical   Primary Care Physician:  Jac Canavan, PA-C   Reason for Procedure:  Colorectal cancer screening  Plan:    Screening colonoscopy with possible interventions as needed     HPI: Jill Barry is a very pleasant 62 y.o. female here for screening colonoscopy. Denies any nausea, vomiting, abdominal pain, melena or bright red blood per rectum  The risks and benefits as well as alternatives of endoscopic procedure(s) have been discussed and reviewed. All questions answered. The patient agrees to proceed.    Past Medical History:  Diagnosis Date   Arthritis    Asthma    Heart murmur    Irregular heart beat    PVC's (premature ventricular contractions)     Past Surgical History:  Procedure Laterality Date   NOSE SURGERY  2017   UPPER GASTROINTESTINAL ENDOSCOPY  10/01/2019    Prior to Admission medications   Medication Sig Start Date End Date Taking? Authorizing Provider  Calcium Carbonate (CALCIUM 500 PO) Take by mouth.   Yes [provider]  estradiol (CLIMARA) 0.025 mg/24hr patch Place 1 patch (0.025 mg total) onto the skin once a week. 05/24/22  Yes Wyline Beady A, NP  Glucos-Chondroit-Hyaluron-MSM (GLUCOSAMINE CHONDROITIN JOINT PO) Take 1 tablet by mouth daily.   Yes [provider]  progesterone (PROMETRIUM) 100 MG capsule Take 1 capsule (100 mg total) by mouth daily. 05/24/22  Yes Wyline Beady A, NP  albuterol (VENTOLIN HFA) 108 (90 Base) MCG/ACT inhaler Inhale 2 puffs into the lungs every 6 (six) hours as needed for wheezing or shortness of breath. 01/16/23   Tysinger, Kermit Balo, PA-C    Current Outpatient Medications  Medication Sig Dispense Refill   Calcium Carbonate (CALCIUM 500 PO) Take by mouth.     estradiol (CLIMARA) 0.025 mg/24hr patch Place 1 patch (0.025 mg total) onto the skin once a week. 24 patch 3   Glucos-Chondroit-Hyaluron-MSM (GLUCOSAMINE CHONDROITIN JOINT PO) Take 1 tablet by mouth  daily.     progesterone (PROMETRIUM) 100 MG capsule Take 1 capsule (100 mg total) by mouth daily. 90 capsule 3   albuterol (VENTOLIN HFA) 108 (90 Base) MCG/ACT inhaler Inhale 2 puffs into the lungs every 6 (six) hours as needed for wheezing or shortness of breath. 6.7 g 3   Current Facility-Administered Medications  Medication Dose Route Frequency Provider Last Rate Last Admin   0.9 %  sodium chloride infusion  500 mL Intravenous Once Napoleon Form, MD        Allergies as of 01/22/2023   (No Known Allergies)    Family History  Problem Relation Age of Onset   Hypertension Father    Diabetes Father    Hyperlipidemia Father    Cervical cancer Sister        Stage IV on chemo   Colon cancer Neg Hx    Colon polyps Neg Hx    Esophageal cancer Neg Hx    Rectal cancer Neg Hx    Stomach cancer Neg Hx     Social History   Socioeconomic History   Marital status: Married    Spouse name: Not on file   Number of children: 2   Years of education: Not on file   Highest education level: Not on file  Occupational History   Occupation: home maker   Tobacco Use   Smoking status: Never   Smokeless tobacco: Never  Vaping Use   Vaping status: Never Used  Substance and Sexual Activity  Alcohol use: Never   Drug use: Never   Sexual activity: Yes    Partners: Male  Other Topics Concern   Not on file  Social History Narrative   Not on file   Social Determinants of Health   Financial Resource Strain: Not on file  Food Insecurity: Not on file  Transportation Needs: Not on file  Physical Activity: Not on file  Stress: Not on file  Social Connections: Not on file  Intimate Partner Violence: Not on file    Review of Systems:  All other review of systems negative except as mentioned in the HPI.  Physical Exam: Vital signs in last 24 hours: BP (!) 146/84   Pulse 65   Temp (!) 96.9 F (36.1 C) (Temporal)   Resp 17   Ht 5\' 3"  (1.6 m)   Wt 120 lb (54.4 kg)   SpO2 100%    BMI 21.26 kg/m  General:   Alert, NAD Lungs:  Clear .   Heart:  Regular rate and rhythm Abdomen:  Soft, nontender and nondistended. Neuro/Psych:  Alert and cooperative. Normal mood and affect. A and O x 3  Reviewed labs, radiology imaging, old records and pertinent past GI work up  Patient is appropriate for planned procedure(s) and anesthesia in an ambulatory setting   K. Scherry Ran , MD [phone]

## 2023-01-23 ENCOUNTER — Telehealth: Payer: Self-pay | Admitting: *Deleted

## 2023-01-23 NOTE — Telephone Encounter (Signed)
  Follow up Call-     01/22/2023    7:25 AM  Call back number  Post procedure Call Back phone  # [phone]  Permission to leave phone message Yes     Patient questions:  Do you have a fever, pain , or abdominal swelling? No. Pain Score  0 *  Have you tolerated food without any problems? Yes.    Have you been able to return to your normal activities? Yes.    Do you have any questions about your discharge instructions: Diet   No. Medications  No. Follow up visit  No.  Do you have questions or concerns about your Care? No.  Actions: * If pain score is 4 or above: No action needed, pain <4.

## 2023-02-11 ENCOUNTER — Other Ambulatory Visit: Payer: Self-pay

## 2023-03-01 ENCOUNTER — Other Ambulatory Visit (HOSPITAL_BASED_OUTPATIENT_CLINIC_OR_DEPARTMENT_OTHER): Payer: Self-pay

## 2023-03-01 MED ORDER — MUPIROCIN 2 % EX OINT
1.0000 | TOPICAL_OINTMENT | Freq: Three times a day (TID) | CUTANEOUS | 2 refills | Status: AC
Start: 2023-03-01 — End: ?
  Filled 2023-03-01: qty 22, 14d supply, fill #0

## 2023-05-10 ENCOUNTER — Other Ambulatory Visit (HOSPITAL_BASED_OUTPATIENT_CLINIC_OR_DEPARTMENT_OTHER): Payer: Self-pay

## 2023-05-10 ENCOUNTER — Other Ambulatory Visit: Payer: Self-pay | Admitting: Nurse Practitioner

## 2023-05-10 DIAGNOSIS — Z79890 Hormone replacement therapy (postmenopausal): Secondary | ICD-10-CM

## 2023-05-10 NOTE — Telephone Encounter (Signed)
Med refill request: Progesterone 100 mg Last AEX: 04/13/22 Next AEX: 05/21/23 Last MMG (if hormonal med) 08/15/22 Refill authorized: Progesterone 100 mg #30. Sent to provider for review.

## 2023-05-13 ENCOUNTER — Other Ambulatory Visit (HOSPITAL_BASED_OUTPATIENT_CLINIC_OR_DEPARTMENT_OTHER): Payer: Self-pay

## 2023-05-13 MED ORDER — PROGESTERONE MICRONIZED 100 MG PO CAPS
100.0000 mg | ORAL_CAPSULE | Freq: Every day | ORAL | 0 refills | Status: DC
  Filled 2023-05-13: qty 30, 30d supply, fill #0

## 2023-05-14 ENCOUNTER — Other Ambulatory Visit (HOSPITAL_BASED_OUTPATIENT_CLINIC_OR_DEPARTMENT_OTHER): Payer: Self-pay

## 2023-05-14 ENCOUNTER — Other Ambulatory Visit: Payer: Self-pay

## 2023-05-17 ENCOUNTER — Other Ambulatory Visit (HOSPITAL_BASED_OUTPATIENT_CLINIC_OR_DEPARTMENT_OTHER): Payer: Self-pay

## 2023-05-21 ENCOUNTER — Ambulatory Visit (INDEPENDENT_AMBULATORY_CARE_PROVIDER_SITE_OTHER): Payer: 59 | Admitting: Nurse Practitioner

## 2023-05-21 ENCOUNTER — Encounter: Payer: Self-pay | Admitting: Nurse Practitioner

## 2023-05-21 ENCOUNTER — Other Ambulatory Visit (HOSPITAL_BASED_OUTPATIENT_CLINIC_OR_DEPARTMENT_OTHER): Payer: Self-pay

## 2023-05-21 VITALS — BP 120/68 | HR 87 | Ht 61.5 in | Wt 122.0 lb

## 2023-05-21 DIAGNOSIS — Z01419 Encounter for gynecological examination (general) (routine) without abnormal findings: Secondary | ICD-10-CM

## 2023-05-21 DIAGNOSIS — Z79890 Hormone replacement therapy (postmenopausal): Secondary | ICD-10-CM | POA: Diagnosis not present

## 2023-05-21 MED ORDER — ESTRADIOL 0.025 MG/24HR TD PTWK
0.0250 mg | MEDICATED_PATCH | TRANSDERMAL | 3 refills | Status: AC
  Filled 2023-05-21 – 2023-08-04 (×3): qty 24, 168d supply, fill #0
  Filled 2023-11-07: qty 12, 84d supply, fill #1
  Filled 2024-02-02: qty 12, 84d supply, fill #2
  Filled 2024-04-27: qty 12, 84d supply, fill #3

## 2023-05-21 MED ORDER — PROGESTERONE MICRONIZED 100 MG PO CAPS
100.0000 mg | ORAL_CAPSULE | Freq: Every day | ORAL | 3 refills | Status: AC
  Filled 2023-05-21 – 2023-06-20 (×2): qty 90, 90d supply, fill #0
  Filled 2023-09-17: qty 60, 60d supply, fill #1
  Filled 2023-11-07: qty 90, 90d supply, fill #2
  Filled 2024-02-02: qty 90, 90d supply, fill #3
  Filled 2024-04-27: qty 90, 90d supply, fill #4

## 2023-05-21 NOTE — Progress Notes (Signed)
Jill Barry 30-Dec-1960 [id number]   History:  63 y.o. G2P0012 presents for annual exam. Postmenopausal - On HRT since 2013 for management of joint pain, mood changes, and hot flashes. Had episodes of light bleeding in 2023, normal U/S 05/2022. No bleeding since. Normal pap and mammogram history.   Gynecologic History No LMP recorded. Patient is postmenopausal.   Contraception/Family planning: post menopausal status Sexually active: Yes  Health maintenance Last Pap: 12/09/2019. Results were: Normal, 5-year repeat Last mammogram: 08/15/2022. Results were: Normal Last colonoscopy: 01/22/2023. Results were: Normal Last Dexa: 02/22/2021. Results were: Normal  Past medical history, past surgical history, family history and social history were all reviewed and documented in the EPIC chart. Married. 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 Husband is hospitalist for [employer]. 2 sons, one primary care MD and other in med school for GI. Sister diagnosed with cervical cancer stage IV.   ROS:  A ROS was performed and pertinent positives and negatives are included.  Exam:  Vitals:   05/21/23 1427  BP: 120/68  Pulse: 87  SpO2: 96%  Weight: 122 lb (55.3 kg)  Height: 5' 1.5" (1.562 m)      Body mass index is 22.68 kg/m.  General appearance:  Normal Thyroid:  Symmetrical, normal in size, without palpable masses or nodularity. Respiratory  Auscultation:  Clear without wheezing or rhonchi Cardiovascular  Auscultation:  Regular rate, without rubs, murmurs or gallops. Occasional PVCs  Edema/varicosities:  Not grossly evident Abdominal  Soft,nontender, without masses, guarding or rebound.  Liver/spleen:  No organomegaly noted  Hernia:  None appreciated  Skin  Inspection:  Grossly normal   Breasts: Examined lying and sitting.   Right: Without masses, retractions, discharge or axillary adenopathy.   Left: Without masses, retractions, discharge or axillary adenopathy. Pelvic: External genitalia:  no lesions               Urethra:  normal appearing urethra with no masses, tenderness or lesions              Bartholins and Skenes: normal                 Vagina: normal appearing vagina with normal color and discharge, no lesions              Cervix: no lesions Bimanual Exam:  Uterus:  no masses or tenderness              Adnexa: no mass, fullness, tenderness              Rectovaginal: Deferred              Anus:  normal, no lesions  Patient informed chaperone available to be present for breast and pelvic exam. Patient has requested no chaperone to be present. Patient has been advised what will be completed during breast and pelvic exam.    Assessment/Plan:  63 y.o. [id number] as new patient.    Well female exam with routine gynecological exam - Education provided on SBEs, importance of preventative screenings, current guidelines, high calcium diet, regular exercise, and multivitamin daily. Labs with PCP.   Postmenopausal hormone therapy - Plan: progesterone (PROMETRIUM) 100 MG capsule nightly, estradiol (CLIMARA) 0.025 mg/24hr patch weekly. Doing well on this. Aware of risks and benefits of use. Is going to [place] to visit mom in April for a few months and may need to get early refill. Will let us know.   Screening for cervical cancer - Normal Pap history.  Will repeat at 5-year  interval per guidelines.  Screening for breast cancer - Normal mammogram history. Continue annual screenings.  Normal breast exam today.  Screening for colon cancer - 01/2023 colonoscopy.   Will repeat at GI's recommended interval.   Screening for osteoporosis - Normal bone density in 2022. Will repeat at 5-year interval per recommendation.   Return in about 1 year (around 05/20/2024) for Annual.     Olivia Mackie [facility], 2:45 PM 05/21/2023.timeout

## 2023-06-21 ENCOUNTER — Other Ambulatory Visit (HOSPITAL_BASED_OUTPATIENT_CLINIC_OR_DEPARTMENT_OTHER): Payer: Self-pay

## 2023-08-05 ENCOUNTER — Other Ambulatory Visit (HOSPITAL_BASED_OUTPATIENT_CLINIC_OR_DEPARTMENT_OTHER): Payer: Self-pay

## 2023-09-17 ENCOUNTER — Other Ambulatory Visit (HOSPITAL_BASED_OUTPATIENT_CLINIC_OR_DEPARTMENT_OTHER): Payer: Self-pay

## 2023-11-07 ENCOUNTER — Other Ambulatory Visit: Payer: Self-pay

## 2023-11-07 ENCOUNTER — Other Ambulatory Visit (HOSPITAL_BASED_OUTPATIENT_CLINIC_OR_DEPARTMENT_OTHER): Payer: Self-pay

## 2024-02-03 ENCOUNTER — Other Ambulatory Visit (HOSPITAL_BASED_OUTPATIENT_CLINIC_OR_DEPARTMENT_OTHER): Payer: Self-pay

## 2024-04-15 ENCOUNTER — Other Ambulatory Visit: Payer: Self-pay | Admitting: Nurse Practitioner

## 2024-04-15 DIAGNOSIS — Z1231 Encounter for screening mammogram for malignant neoplasm of breast: Secondary | ICD-10-CM

## 2024-04-28 ENCOUNTER — Other Ambulatory Visit: Payer: Self-pay | Admitting: Nurse Practitioner

## 2024-04-28 ENCOUNTER — Other Ambulatory Visit (HOSPITAL_BASED_OUTPATIENT_CLINIC_OR_DEPARTMENT_OTHER): Payer: Self-pay

## 2024-04-28 DIAGNOSIS — Z79890 Hormone replacement therapy (postmenopausal): Secondary | ICD-10-CM

## 2024-04-29 ENCOUNTER — Other Ambulatory Visit (HOSPITAL_BASED_OUTPATIENT_CLINIC_OR_DEPARTMENT_OTHER): Payer: Self-pay

## 2024-04-29 MED ORDER — PROGESTERONE MICRONIZED 100 MG PO CAPS
100.0000 mg | ORAL_CAPSULE | Freq: Every day | ORAL | 0 refills | Status: AC
  Filled 2024-04-29: qty 90, 90d supply, fill #0

## 2024-04-29 NOTE — Telephone Encounter (Signed)
 Medication refill request: prometrium  100mg  Last AEX:  05-21-23 Next AEX: not scheduled Last MMG (if hormonal medication request): 08-15-22 birads 1:neg, scheduled for 06-12-24 Refill authorized: supply with 0 refills sent to pharmacy.

## 2024-05-28 ENCOUNTER — Ambulatory Visit: Admitting: Nurse Practitioner

## 2024-06-12 ENCOUNTER — Ambulatory Visit
